# Patient Record
Sex: Female | Born: 1960 | Race: Black or African American | Hispanic: No | Smoking: Never smoker
Health system: Southern US, Community
[De-identification: ages and names within clinical notes are randomized; demographics above are authoritative.]

## PROBLEM LIST (undated history)

## (undated) ENCOUNTER — Emergency Department (HOSPITAL_COMMUNITY): Admission: EM | Payer: No Typology Code available for payment source | Source: Home / Self Care

## (undated) DIAGNOSIS — E119 Type 2 diabetes mellitus without complications: Secondary | ICD-10-CM

## (undated) DIAGNOSIS — J45909 Unspecified asthma, uncomplicated: Secondary | ICD-10-CM

## (undated) DIAGNOSIS — K5190 Ulcerative colitis, unspecified, without complications: Secondary | ICD-10-CM

## (undated) DIAGNOSIS — I10 Essential (primary) hypertension: Secondary | ICD-10-CM

## (undated) HISTORY — DX: Ulcerative colitis, unspecified, without complications: K51.90

## (undated) HISTORY — DX: Unspecified asthma, uncomplicated: J45.909

---

## 2015-04-23 ENCOUNTER — Encounter (HOSPITAL_BASED_OUTPATIENT_CLINIC_OR_DEPARTMENT_OTHER): Payer: Self-pay | Admitting: *Deleted

## 2015-04-23 ENCOUNTER — Emergency Department (HOSPITAL_BASED_OUTPATIENT_CLINIC_OR_DEPARTMENT_OTHER): Payer: BLUE CROSS/BLUE SHIELD

## 2015-04-23 ENCOUNTER — Emergency Department (HOSPITAL_BASED_OUTPATIENT_CLINIC_OR_DEPARTMENT_OTHER)
Admission: EM | Admit: 2015-04-23 | Discharge: 2015-04-23 | Disposition: A | Discharge status: Home / Self Care | Payer: BLUE CROSS/BLUE SHIELD | Attending: Emergency Medicine | Admitting: Emergency Medicine

## 2015-04-23 DIAGNOSIS — Y9289 Other specified places as the place of occurrence of the external cause: Secondary | ICD-10-CM | POA: Insufficient documentation

## 2015-04-23 DIAGNOSIS — Y9389 Activity, other specified: Secondary | ICD-10-CM | POA: Insufficient documentation

## 2015-04-23 DIAGNOSIS — Y990 Civilian activity done for income or pay: Secondary | ICD-10-CM | POA: Insufficient documentation

## 2015-04-23 DIAGNOSIS — S8992XA Unspecified injury of left lower leg, initial encounter: Secondary | ICD-10-CM | POA: Diagnosis not present

## 2015-04-23 DIAGNOSIS — M25462 Effusion, left knee: Secondary | ICD-10-CM | POA: Diagnosis not present

## 2015-04-23 DIAGNOSIS — X58XXXA Exposure to other specified factors, initial encounter: Secondary | ICD-10-CM | POA: Diagnosis not present

## 2015-04-23 MED ORDER — IBUPROFEN 600 MG PO TABS: 600.0000 mg | ORAL_TABLET | Freq: Four times a day (QID) | ORAL | Status: DC | PRN

## 2015-04-23 MED ORDER — OXYCODONE-ACETAMINOPHEN 5-325 MG PO TABS
1.0000 | ORAL_TABLET | Freq: Once | ORAL | Status: AC
  Administered 2015-04-23: 1 via ORAL
  Filled 2015-04-23: qty 1

## 2015-04-23 NOTE — ED Notes (Signed)
Patient transported to X-ray via stretcher per tech. 

## 2015-04-23 NOTE — ED Provider Notes (Signed)
CSN: [id number]     Arrival date & time 04/23/15  1134 History   First MD Initiated Contact with Patient 04/23/15 1201     Chief Complaint  Patient presents with  . Knee Pain     (Consider location/radiation/quality/duration/timing/severity/associated sxs/prior Treatment) HPI Comments: Pt. Is a 54 y/o F here with acute onset left knee pain. 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 She says that her pain has hurt more on the medial side, and that it has a "crunching" sound when she walks. She says she iced her knee when she got home, elevated it but has not taken anything for pain. She says that she has not had any further giving out, but the pain persists. She has had very little swelling, no ecchymosis. She is able to bear weight but continues to limp. She has never injured her knee. She says she has no other known medical problems. She used a knee sleeve for stabilization over the past few days. The joint is not red, swollen, she has not had any fevers or chills.   The history is provided by the patient.    History reviewed. No pertinent past medical history. Past Surgical History  Procedure Laterality Date  . Cesarean section     No family history on file. History  Substance Use Topics  . Smoking status: Never Smoker   . Smokeless tobacco: Never Used  . Alcohol Use: No   OB History    No data available     Review of Systems  Constitutional: Negative.  Negative for fever, chills, diaphoresis, activity change, appetite change and fatigue.  HENT: Negative for congestion, sore throat, trouble swallowing and voice change.   Eyes: Negative.  Negative for photophobia, pain, redness and visual disturbance.  Respiratory: Negative.  Negative for cough, choking and wheezing.   Cardiovascular: Negative.   Negative for chest pain, palpitations and leg swelling.  Gastrointestinal: Negative.  Negative for nausea, vomiting, abdominal pain, diarrhea, constipation and abdominal distention.  Endocrine: Negative.  Negative for polyuria.  Genitourinary: Negative.  Negative for frequency.  Musculoskeletal: Positive for joint swelling, arthralgias and gait problem. Negative for myalgias, back pain, neck pain and neck stiffness.  Skin: Negative.  Negative for rash and wound.  Allergic/Immunologic: Negative.   Neurological: Negative.  Negative for dizziness, numbness and headaches.  Hematological: Negative.   Psychiatric/Behavioral: Negative.  Negative for behavioral problems and agitation.      Allergies  Review of patient's allergies indicates no known allergies.  Home Medications   Prior to Admission medications   Medication Sig Start Date End Date Taking? Authorizing Provider  ibuprofen (ADVIL,MOTRIN) 600 MG tablet Take 1 tablet (600 mg total) by mouth every 6 (six) hours as needed. 04/23/15   Hillery Hunter Meli Faley, MD   BP 120/78 mmHg  Pulse 69  Temp(Src) 98.4 F (36.9 C) (Oral)  Resp 18  Ht 5\' 2"  (1.575 m)  Wt 207 lb (93.895 kg)  BMI 37.85 kg/m2  SpO2 99% Physical Exam  Constitutional: She is oriented to person, place, and time. She appears well-developed and well-nourished. No distress.  HENT:  Head: Normocephalic and atraumatic.  Mouth/Throat: Oropharynx is clear and moist. No oropharyngeal exudate.  Eyes: Conjunctivae and EOM are normal. Pupils are equal,  round, and reactive to light.  Neck: Normal range of motion. Neck supple. No thyromegaly present.  Cardiovascular: Normal rate, regular rhythm, normal heart sounds and intact distal pulses.  Exam reveals no gallop and no friction rub.   No murmur heard. Pulmonary/Chest: Effort normal and breath sounds normal. No respiratory distress. She has no wheezes. She has no rales. She exhibits no tenderness.  Abdominal: Soft. Bowel sounds are  normal. She exhibits no distension and no mass. There is no tenderness. There is no rebound and no guarding.  Musculoskeletal: She exhibits tenderness. She exhibits no edema.       Left knee: She exhibits decreased range of motion, swelling, effusion, bony tenderness and abnormal meniscus. She exhibits no ecchymosis, no deformity, no laceration, no erythema, normal alignment, no LCL laxity, normal patellar mobility and no MCL laxity. Tenderness found. Medial joint line, MCL and patellar tendon tenderness noted. No lateral joint line and no LCL tenderness noted.       Legs: Lymphadenopathy:    She has no cervical adenopathy.  Neurological: She is alert and oriented to person, place, and time. She exhibits normal muscle tone. Coordination normal.  Skin: Skin is warm and dry. No rash noted. She is not diaphoretic. No erythema. No pallor.  Psychiatric: She has a normal mood and affect. Her behavior is normal.    ED Course  Procedures (including critical care time) Labs Review Labs Reviewed - No data to display  Imaging Review Dg Knee Complete 4 Views Left  04/23/2015   CLINICAL DATA:  LEFT knee pain for 1 week  EXAM: LEFT KNEE - COMPLETE 4+ VIEW  COMPARISON:  None.  FINDINGS: No fracture dislocation of the LEFT knee. There is spurring of the superior aspect of the patella. Small suprapatellar joint effusion.  IMPRESSION: Small joint effusion.  No acute findings otherwise.   Electronically Signed   By: Genevive Bi M.D.   On: 04/23/2015 12:34     EKG Interpretation None      MDM   Final diagnoses:  Knee injury, left, initial encounter    Pt. Is 54 y/o F here with knee injury while ambulating on flat surface. By exam most likely MCL / Meniscus. XR knee is negative for acute bony abnormality, some soft tissue swelling. Recommend RICE therapy, compression sleeve. NSAID's for pain. Referral to sports medicine placed. Pt. To call for follow up appointment with sports medicine. Safe for  discharge to home with close follow up.     Yolande Jolly, MD 04/23/15 1538  Nelva Nay, MD 04/24/15 [phone]

## 2015-04-23 NOTE — Discharge Instructions (Signed)
Knee, Cartilage (Meniscus) Injury  It is suspected that you have a torn cartilage (meniscus) in your knee. The menisci are made of tough cartilage and fit between the surfaces of the thigh and leg bones. The menisci are C-shaped and have a wedged profile. The wedged profile helps the stability of the joint by keeping the rounded femur surface from sliding off the flat tibial surface. The menisci are fed (nourished) by small blood vessels, but there is also a large area at the inner edge of the meniscus that does not have a good blood supply (avascular). This presents a problem when there is an injury to the meniscus because areas without good blood supply heal poorly. As a result when there is a torn cartilage in the knee, surgery is often required to fix it. This is usually done with a surgical procedure less invasive than open surgery (arthroscopy). Some times open surgery of the knee is required if there is other damage.  PURPOSE OF THE MENISCUS  The medial meniscus rests on the medial tibial plateau. The tibia is the large bone in your lower leg (the shin bone). The medial tibial plateau is the upper end of the bone making up the inner part of your knee. The lateral meniscus serves the same purpose and is located on the outside of the knee. The menisci help to distribute your body weight across the knee joint; they act as shock absorbers. Without the meniscus present, the weight of your body would be unevenly applied to the bones in your legs (the femur and tibia). The femur is the large bone in your thigh. This uneven weight distribution would cause increased wear and tear on the cartilage lining the joint surfaces, leading to early damage (arthritis) of these areas. The presence of the menisci cartilage is necessary for a healthy knee.  PURPOSE OF THE KNEE CARTILAGE  The knee joint is made up of three bones: the thigh bone (femur), the shin bone (tibia), and the knee cap (patella). The surfaces of these bones  at the knee joint are covered with cartilage called articular cartilage. This smooth, slippery surface allows the bones to slide against each other without causing bone damage. The meniscus sits between these cartilaginous surfaces of the bones. It distributes the weight evenly in the joints and helps with the stability of the joint (keeps the joint steady).  HOME CARE INSTRUCTIONS  · Use crutches and external braces as instructed.  · Once home, an ice pack applied to your injured knee may help with discomfort and keep the swelling down. An ice pack can be used for the first couple of days or as instructed.  · Only take over-the-counter or prescription medicines for pain, discomfort, or fever as directed by your caregiver.  · Call if you do not have relief of pain with medications or if there is increasing in pain.  · Call if your foot becomes cold or blue.  · You may resume normal diet and activities as directed.  · Make sure to keep your appointments with your follow-up caregiver. This injury may require further evaluation and treatment beyond the temporary treatment given today.  Document Released: 12/01/2002 Document Revised: 01/25/2014 Document Reviewed: 03/25/2009  [employer]® Patient Information ©2015 [employer]. This information is not intended to replace advice given to you by your health care provider. Make sure you discuss any questions you have with your health care provider.

## 2015-04-23 NOTE — ED Notes (Signed)
Pt reports pain in left knee and has been "giving out" since Wednesday- denies known injury, denies fall

## 2015-04-26 ENCOUNTER — Ambulatory Visit (INDEPENDENT_AMBULATORY_CARE_PROVIDER_SITE_OTHER): Payer: BLUE CROSS/BLUE SHIELD | Admitting: Family Medicine

## 2015-04-26 ENCOUNTER — Encounter: Payer: Self-pay | Admitting: Family Medicine

## 2015-04-26 VITALS — BP 137/84 | HR 90 | Ht 62.0 in | Wt 207.0 lb

## 2015-04-26 DIAGNOSIS — M25562 Pain in left knee: Secondary | ICD-10-CM | POA: Diagnosis not present

## 2015-04-26 DIAGNOSIS — M222X2 Patellofemoral disorders, left knee: Secondary | ICD-10-CM | POA: Insufficient documentation

## 2015-04-26 MED ORDER — METHYLPREDNISOLONE ACETATE 40 MG/ML IJ SUSP
40.0000 mg | Freq: Once | INTRAMUSCULAR | Status: AC
  Administered 2015-04-26: 40 mg via INTRA_ARTICULAR

## 2015-04-26 NOTE — Progress Notes (Signed)
PCP: No primary care provider on file.  Subjective:   HPI: Patient is a 54 y.o. female here for left knee pain.  Patient reports on 7/27 she was walking at work and felt her knee pop. Pop was felt anteromedial. Has been elevating, icing knee since then. No prior injuries. Limping as well. Wearing a knee sleeve. Difficulty getting up after prolonged sitting. + swelling. No catching, locking though concerned knee will give out.  No past medical history on file.  Current Outpatient Prescriptions on File Prior to Visit  Medication Sig Dispense Refill  . ibuprofen (ADVIL,MOTRIN) 600 MG tablet Take 1 tablet (600 mg total) by mouth every 6 (six) hours as needed. 30 tablet 0   No current facility-administered medications on file prior to visit.    Past Surgical History  Procedure Laterality Date  . Cesarean section      No Known Allergies  History   Social History  . Marital Status: Divorced    Spouse Name: N/A  . Number of Children: N/A  . Years of Education: N/A   Occupational History  . Not on file.   Social History Main Topics  . Smoking status: Never Smoker   . Smokeless tobacco: Never Used  . Alcohol Use: No  . Drug Use: No  . Sexual Activity: Not on file   Other Topics Concern  . Not on file   Social History Narrative    No family history on file.  BP 137/84 mmHg  Pulse 90  Ht  (1.575 m)  Wt 207 lb (93.895 kg)  BMI 37.85 kg/m2  Review of Systems: See HPI above.    Objective:  Physical Exam:  Gen: NAD  Left knee: Mild effusion.  No bruising, other deformity. TTP medial joint line and pes bursa ROM 0 - 90 degrees. Negative ant/post drawers. Negative valgus/varus testing. Negative lachmanns. Positive mcmurrays, apleys.  Negative patellar apprehension. NV intact distally.    Assessment & Plan:  1. Left knee pain - consistent with medial meniscus tear with some pes bursitis.  Radiographs negative for fracture.  Not weight bearing with  these but preserved joint spaces.  Discussed options - start with conservative management - tylenol, nsaids, topical medications, home exercises.  Injection given today.  F/u in 1 month.  After informed written consent, patient was lying supine on exam table. Left knee was prepped with alcohol swab and utilizing superolateral approach with ultrasound guidance, patient's left knee was injected intraarticularly with 3:1 marcaine: depomedrol. Patient tolerated the procedure well without immediate complications.

## 2015-04-26 NOTE — Patient Instructions (Signed)
I'm concerned you have a medial meniscus tear in this knee. Treatment for this is conservative as 80-85% of these heal on their own or become asymptomatic. Take tylenol  1-2 tabs three times a day for pain. Aleve 1-2 tabs twice a day with food Consider Capsaicin, aspercreme, or biofreeze topically up to four times a day - may also help with pain. Cortisone injections are an option - you were given one of these today. It's important that you continue to stay active. Straight leg raises, knee extensions 3 sets of 10 once a day (add ankle weight if these become too easy). Consider physical therapy to strengthen muscles around the joint that hurts to take pressure off of the joint itself. Heat or ice 15 minutes at a time 3-4 times a day as needed to help with pain. Follow up with me in 1 month. See work note for the few restrictions.

## 2015-04-26 NOTE — Assessment & Plan Note (Signed)
consistent with medial meniscus tear with some pes bursitis.  Radiographs negative for fracture.  Not weight bearing with these but preserved joint spaces.  Discussed options - start with conservative management - tylenol, nsaids, topical medications, home exercises.  Injection given today.  F/u in 1 month.  After informed written consent, patient was lying supine on exam table. Left knee was prepped with alcohol swab and utilizing superolateral approach with ultrasound guidance, patient's left knee was injected intraarticularly with 3:1 marcaine: depomedrol. Patient tolerated the procedure well without immediate complications.

## 2015-08-05 ENCOUNTER — Ambulatory Visit (INDEPENDENT_AMBULATORY_CARE_PROVIDER_SITE_OTHER): Payer: BLUE CROSS/BLUE SHIELD | Admitting: Family Medicine

## 2015-08-05 ENCOUNTER — Encounter: Payer: Self-pay | Admitting: Family Medicine

## 2015-08-05 VITALS — BP 142/93 | HR 84 | Ht 62.0 in

## 2015-08-05 DIAGNOSIS — M25562 Pain in left knee: Secondary | ICD-10-CM | POA: Diagnosis not present

## 2015-08-05 NOTE — Patient Instructions (Signed)
Light duty with the only restrictions being no deep squats, crawling for about 6 weeks then return to full duty. I don't think you're going to have any problems going forward but it's not uncommon to have a little soreness. Follow up with me in 6 weeks or as needed.

## 2015-08-08 NOTE — Progress Notes (Signed)
PCP: No primary care provider on file.  Subjective:   HPI: Patient is a 54 y.o. female here for left knee pain.  8/2: Patient reports on 7/27 she was walking at work and felt her knee pop. Pop was felt anteromedial. Has been elevating, icing knee since then. No prior injuries. Limping as well. Wearing a knee sleeve. Difficulty getting up after prolonged sitting. + swelling. No catching, locking though concerned knee will give out.  11/11: Patient reports her left knee is improved since last visit, injection. Pain level 0/10. Occasional pain getting up from seated position. No skin changes, fever, other complaints.  No past medical history on file.  No current outpatient prescriptions on file prior to visit.   No current facility-administered medications on file prior to visit.    Past Surgical History  Procedure Laterality Date  . Cesarean section      No Known Allergies  Social History   Social History  . Marital Status: Divorced    Spouse Name: N/A  . Number of Children: N/A  . Years of Education: N/A   Occupational History  . Not on file.   Social History Main Topics  . Smoking status: Never Smoker   . Smokeless tobacco: Never Used  . Alcohol Use: No  . Drug Use: No  . Sexual Activity: Not on file   Other Topics Concern  . Not on file   Social History Narrative    No family history on file.  BP 142/93 mmHg  Pulse 84  Ht 5\' 2"  (1.575 m)  Review of Systems: See HPI above.    Objective:  Physical Exam:  Gen: NAD, comfortable in exam room.  Left knee: No effusion.  No bruising, other deformity. No tenderness. FROM. Negative ant/post drawers. Negative valgus/varus testing. Negative lachmanns. Negative mcmurrays, apleys.  Negative patellar apprehension. NV intact distally.  Right knee: FROM without pain.    Assessment & Plan:  1. Left knee pain - consistent with medial meniscus tear with some pes bursitis.  Clinically improved at  this point following injection, home exercises.  Light duty (see letter) for 6 weeks then full duty.  F/u in 6 weeks or prn.

## 2015-08-08 NOTE — Assessment & Plan Note (Signed)
consistent with medial meniscus tear with some pes bursitis.  Clinically improved at this point following injection, home exercises.  Light duty (see letter) for 6 weeks then full duty.  F/u in 6 weeks or prn.

## 2015-09-14 ENCOUNTER — Encounter: Payer: Self-pay | Admitting: Family Medicine

## 2015-09-14 ENCOUNTER — Ambulatory Visit (INDEPENDENT_AMBULATORY_CARE_PROVIDER_SITE_OTHER): Payer: BLUE CROSS/BLUE SHIELD | Admitting: Family Medicine

## 2015-09-14 VITALS — BP 149/91 | HR 96 | Ht 62.0 in | Wt 207.0 lb

## 2015-09-14 DIAGNOSIS — M25562 Pain in left knee: Secondary | ICD-10-CM

## 2015-09-14 NOTE — Assessment & Plan Note (Signed)
consistent with medial meniscus tear with some pes bursitis.  Clinically significantly better.  Return to full duty.  F/u prn.

## 2015-09-14 NOTE — Progress Notes (Signed)
PCP: No PCP Per Patient  Subjective:   HPI: Patient is a 54 y.o. female here for left knee pain.  8/2: Patient reports on 7/27 she was walking at work and felt her knee pop. Pop was felt anteromedial. Has been elevating, icing knee since then. No prior injuries. Limping as well. Wearing a knee sleeve. Difficulty getting up after prolonged sitting. + swelling. No catching, locking though concerned knee will give out.  11/11: Patient reports her left knee is improved since last visit, injection. Pain level 0/10. Occasional pain getting up from seated position. No skin changes, fever, other complaints.  12/21: Patient reports she is over 99% improved. No pain - gets occasional soreness that is fleeting. Pain 0/10 now. Doing home exercises. No skin changes, fever, other complaints.  No past medical history on file.  Current Outpatient Prescriptions on File Prior to Visit  Medication Sig Dispense Refill  . meloxicam (MOBIC) 15 MG tablet      No current facility-administered medications on file prior to visit.    Past Surgical History  Procedure Laterality Date  . Cesarean section      No Known Allergies  Social History   Social History  . Marital Status: Divorced    Spouse Name: N/A  . Number of Children: N/A  . Years of Education: N/A   Occupational History  . Not on file.   Social History Main Topics  . Smoking status: Never Smoker   . Smokeless tobacco: Never Used  . Alcohol Use: No  . Drug Use: No  . Sexual Activity: Not on file   Other Topics Concern  . Not on file   Social History Narrative    No family history on file.  BP 149/91 mmHg  Pulse 96  Ht 5\' 2"  (1.575 m)  Wt 207 lb (93.895 kg)  BMI 37.85 kg/m2  Review of Systems: See HPI above.    Objective:  Physical Exam:  Gen: NAD, comfortable in exam room.  Left knee: No effusion.  No bruising, other deformity. No tenderness. FROM. Negative ant/post drawers. Negative  valgus/varus testing. Negative lachmanns. Negative mcmurrays, apleys.  Negative patellar apprehension. NV intact distally.  Right knee: FROM without pain.    Assessment & Plan:  1. Left knee pain - consistent with medial meniscus tear with some pes bursitis.  Clinically significantly better.  Return to full duty.  F/u prn.

## 2017-10-26 ENCOUNTER — Encounter (HOSPITAL_COMMUNITY): Payer: Self-pay | Admitting: Emergency Medicine

## 2017-10-26 ENCOUNTER — Emergency Department (HOSPITAL_COMMUNITY): Payer: BLUE CROSS/BLUE SHIELD

## 2017-10-26 ENCOUNTER — Emergency Department (HOSPITAL_COMMUNITY)
Admission: EM | Admit: 2017-10-26 | Discharge: 2017-10-26 | Disposition: A | Discharge status: Home / Self Care | Payer: BLUE CROSS/BLUE SHIELD | Attending: Emergency Medicine | Admitting: Emergency Medicine

## 2017-10-26 DIAGNOSIS — M545 Low back pain: Secondary | ICD-10-CM | POA: Diagnosis not present

## 2017-10-26 DIAGNOSIS — S6991XA Unspecified injury of right wrist, hand and finger(s), initial encounter: Secondary | ICD-10-CM | POA: Diagnosis present

## 2017-10-26 DIAGNOSIS — R202 Paresthesia of skin: Secondary | ICD-10-CM | POA: Diagnosis not present

## 2017-10-26 DIAGNOSIS — Y92410 Unspecified street and highway as the place of occurrence of the external cause: Secondary | ICD-10-CM | POA: Insufficient documentation

## 2017-10-26 DIAGNOSIS — Y999 Unspecified external cause status: Secondary | ICD-10-CM | POA: Diagnosis not present

## 2017-10-26 DIAGNOSIS — M79641 Pain in right hand: Secondary | ICD-10-CM | POA: Diagnosis not present

## 2017-10-26 DIAGNOSIS — M79642 Pain in left hand: Secondary | ICD-10-CM | POA: Insufficient documentation

## 2017-10-26 DIAGNOSIS — E119 Type 2 diabetes mellitus without complications: Secondary | ICD-10-CM | POA: Insufficient documentation

## 2017-10-26 DIAGNOSIS — Y9389 Activity, other specified: Secondary | ICD-10-CM | POA: Insufficient documentation

## 2017-10-26 DIAGNOSIS — I10 Essential (primary) hypertension: Secondary | ICD-10-CM | POA: Diagnosis not present

## 2017-10-26 HISTORY — DX: Essential (primary) hypertension: I10

## 2017-10-26 HISTORY — DX: Type 2 diabetes mellitus without complications: E11.9

## 2017-10-26 LAB — CBC
HCT: 40.9 % (ref 36.0–46.0)
Hemoglobin: 13.5 g/dL (ref 12.0–15.0)
MCH: 26.2 pg (ref 26.0–34.0)
MCHC: 33 g/dL (ref 30.0–36.0)
MCV: 79.3 fL (ref 78.0–100.0)
Platelets: 207 10*3/uL (ref 150–400)
RBC: 5.16 MIL/uL — ABNORMAL HIGH (ref 3.87–5.11)
RDW: 15.5 % (ref 11.5–15.5)
WBC: 6.7 10*3/uL (ref 4.0–10.5)

## 2017-10-26 LAB — COMPREHENSIVE METABOLIC PANEL
ALT: 15 U/L (ref 14–54)
AST: 29 U/L (ref 15–41)
Albumin: 3.9 g/dL (ref 3.5–5.0)
Alkaline Phosphatase: 63 U/L (ref 38–126)
Anion gap: 11 (ref 5–15)
BILIRUBIN TOTAL: 0.9 mg/dL (ref 0.3–1.2)
BUN: 8 mg/dL (ref 6–20)
CO2: 23 mmol/L (ref 22–32)
CREATININE: 0.76 mg/dL (ref 0.44–1.00)
Calcium: 9 mg/dL (ref 8.9–10.3)
Chloride: 101 mmol/L (ref 101–111)
GFR calc Af Amer: >60 mL/min (ref >60)
Glucose, Bld: 97 mg/dL (ref 65–99)
POTASSIUM: 4.4 mmol/L (ref 3.5–5.1)
Sodium: 135 mmol/L (ref 135–145)
TOTAL PROTEIN: 7.6 g/dL (ref 6.5–8.1)

## 2017-10-26 LAB — PROTIME-INR
INR: 0.93
PROTHROMBIN TIME: 12.4 s (ref 11.4–15.2)

## 2017-10-26 LAB — SAMPLE TO BLOOD BANK

## 2017-10-26 LAB — I-STAT CHEM 8, ED
BUN: 8 mg/dL (ref 6–20)
CREATININE: 0.7 mg/dL (ref 0.44–1.00)
Calcium, Ion: 1.06 mmol/L — ABNORMAL LOW (ref 1.15–1.40)
Chloride: 103 mmol/L (ref 101–111)
Glucose, Bld: 99 mg/dL (ref 65–99)
HEMATOCRIT: 43 % (ref 36.0–46.0)
Hemoglobin: 14.6 g/dL (ref 12.0–15.0)
Potassium: 4.1 mmol/L (ref 3.5–5.1)
Sodium: 139 mmol/L (ref 135–145)
TCO2: 26 mmol/L (ref 22–32)

## 2017-10-26 LAB — URINALYSIS, ROUTINE W REFLEX MICROSCOPIC
Bilirubin Urine: NEGATIVE
Glucose, UA: NEGATIVE mg/dL
Hgb urine dipstick: NEGATIVE
Ketones, ur: NEGATIVE mg/dL
Leukocytes, UA: NEGATIVE
NITRITE: NEGATIVE
PROTEIN: NEGATIVE mg/dL
Specific Gravity, Urine: >1.046 — ABNORMAL HIGH (ref 1.005–1.030)
pH: 8 (ref 5.0–8.0)

## 2017-10-26 LAB — ETHANOL: Alcohol, Ethyl (B): <10 mg/dL (ref <10)

## 2017-10-26 LAB — I-STAT CG4 LACTIC ACID, ED: LACTIC ACID, VENOUS: 1.57 mmol/L (ref 0.5–1.9)

## 2017-10-26 MED ORDER — FENTANYL CITRATE (PF) 100 MCG/2ML IJ SOLN
25.0000 ug | Freq: Once | INTRAMUSCULAR | Status: AC
  Administered 2017-10-26: 25 ug via INTRAVENOUS
  Filled 2017-10-26: qty 2

## 2017-10-26 MED ORDER — ONDANSETRON HCL 4 MG/2ML IJ SOLN
4.0000 mg | Freq: Once | INTRAMUSCULAR | Status: AC
  Administered 2017-10-26: 4 mg via INTRAVENOUS
  Filled 2017-10-26: qty 2

## 2017-10-26 MED ORDER — IOPAMIDOL (ISOVUE-300) INJECTION 61%
INTRAVENOUS | Status: AC
  Administered 2017-10-26: 100 mL
  Filled 2017-10-26: qty 100
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
## 2017-10-26 NOTE — ED Notes (Signed)
Pt understood dc material. NAD noted. Work excuse given at dc 
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
## 2017-10-26 NOTE — ED Notes (Signed)
MD at bedside. 

## 2017-10-26 NOTE — ED Notes (Signed)
Pt ambulated without trouble. Stated she felt fine and was ready to go home

## 2017-10-26 NOTE — Discharge Instructions (Addendum)
When advanced CT imaging is performed, often finds unrelated to today's visit are identified.  Please discuss these incidental findings with your primary care doctor. 1. Multiple benign liver cysts. 2. Nonobstructing right intrarenal calculus. 3. Small left renal cyst. Aortic atherosclerosis.

## 2017-10-27 LAB — CDS SEROLOGY

## 2017-11-04 ENCOUNTER — Encounter (HOSPITAL_COMMUNITY): Payer: Self-pay | Admitting: Emergency Medicine

## 2017-11-04 ENCOUNTER — Ambulatory Visit (HOSPITAL_COMMUNITY)
Admission: EM | Admit: 2017-11-04 | Discharge: 2017-11-04 | Disposition: A | Discharge status: Home / Self Care | Payer: BLUE CROSS/BLUE SHIELD | Attending: Internal Medicine | Admitting: Internal Medicine

## 2017-11-04 DIAGNOSIS — S39012A Strain of muscle, fascia and tendon of lower back, initial encounter: Secondary | ICD-10-CM

## 2017-11-04 MED ORDER — CYCLOBENZAPRINE HCL 5 MG PO TABS: 5.0000 mg | ORAL_TABLET | Freq: Every day | ORAL | 0 refills | Status: DC

## 2017-11-04 MED ORDER — NAPROXEN 375 MG PO TABS: 375.0000 mg | ORAL_TABLET | Freq: Two times a day (BID) | ORAL | 0 refills | Status: DC

## 2017-11-04 MED ORDER — PREDNISONE 10 MG (21) PO TBPK: ORAL_TABLET | Freq: Every day | ORAL | 0 refills | Status: DC
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
## 2017-11-04 NOTE — Discharge Instructions (Signed)
Heat/ice to low back. Activity as tolerated.  Complete course of steroids. Naproxen twice a day, take with food May take a muscle relaxer at night, may cause drowsiness so do not drive or drink alcohol if take. Please establish with a primary care provider for further management of your low back pain.

## 2017-11-20 ENCOUNTER — Ambulatory Visit: Payer: Self-pay | Admitting: Emergency Medicine

## 2017-11-22 ENCOUNTER — Ambulatory Visit (INDEPENDENT_AMBULATORY_CARE_PROVIDER_SITE_OTHER): Payer: BLUE CROSS/BLUE SHIELD | Admitting: Emergency Medicine

## 2017-11-22 ENCOUNTER — Other Ambulatory Visit: Payer: Self-pay

## 2017-11-22 ENCOUNTER — Encounter: Payer: Self-pay | Admitting: Emergency Medicine

## 2017-11-22 VITALS — BP 128/66 | HR 77 | Temp 98.1°F | Resp 16 | Ht 62.0 in | Wt 206.0 lb

## 2017-11-22 DIAGNOSIS — M545 Low back pain, unspecified: Secondary | ICD-10-CM

## 2017-11-22 DIAGNOSIS — M7918 Myalgia, other site: Secondary | ICD-10-CM | POA: Insufficient documentation

## 2017-11-22 DIAGNOSIS — M62830 Muscle spasm of back: Secondary | ICD-10-CM | POA: Diagnosis not present

## 2017-11-22 MED ORDER — DICLOFENAC SODIUM 75 MG PO TBEC: 75.0000 mg | DELAYED_RELEASE_TABLET | Freq: Two times a day (BID) | ORAL | 0 refills | Status: AC

## 2017-11-22 MED ORDER — CYCLOBENZAPRINE HCL 10 MG PO TABS: 10.0000 mg | ORAL_TABLET | Freq: Every day | ORAL | 0 refills | Status: DC

## 2017-11-22 NOTE — Patient Instructions (Addendum)
     IF you received an x-ray today, you will receive an invoice from [facility]. Please contact [facility] at [phone] with questions or concerns regarding your invoice.   IF you received labwork today, you will receive an invoice from [facility]. Please contact [facility] at [phone] with questions or concerns regarding your invoice.   Our billing staff will not be able to assist you with questions regarding bills from these companies.  You will be contacted with the lab results as soon as they are available. The fastest way to get your results is to activate your My Chart account. Instructions are located on the last page of this paperwork. If you have not heard from us regarding the results in 2 weeks, please contact this office.      Back Pain, Adult Back pain is very common. The pain often gets better over time. The cause of back pain is usually not dangerous. Most people can learn to manage their back pain on their own. Follow these instructions at home: Watch your back pain for any changes. The following actions may help to lessen any pain you are feeling:  Stay active. Start with short walks on flat ground if you can. Try to walk farther each day.  Exercise regularly as told by your doctor. Exercise helps your back heal faster. It also helps avoid future injury by keeping your muscles strong and flexible.  Do not sit, drive, or stand in one place for more than 30 minutes.  Do not stay in bed. Resting more than 1-2 days can slow down your recovery.  Be careful when you bend or lift an object. Use good form when lifting: ? Bend at your knees. ? Keep the object close to your body. ? Do not twist.  Sleep on a firm mattress. Lie on your side, and bend your knees. If you lie on your back, put a pillow under your knees.  Take medicines only as told by your doctor.  Put ice on the injured area. ? Put ice in a plastic bag. ? Place a towel between your  skin and the bag. ? Leave the ice on for 20 minutes, 2-3 times a day for the first 2-3 days. After that, you can switch between ice and heat packs.  Avoid feeling anxious or stressed. Find good ways to deal with stress, such as exercise.  Maintain a healthy weight. Extra weight puts stress on your back.  Contact a doctor if:  You have pain that does not go away with rest or medicine.  You have worsening pain that goes down into your legs or buttocks.  You have pain that does not get better in one week.  You have pain at night.  You lose weight.  You have a fever or chills. Get help right away if:  You cannot control when you poop (bowel movement) or pee (urinate).  Your arms or legs feel weak.  Your arms or legs lose feeling (numbness).  You feel sick to your stomach (nauseous) or throw up (vomit).  You have belly (abdominal) pain.  You feel like you may pass out (faint). This information is not intended to replace advice given to you by your health care provider. Make sure you discuss any questions you have with your health care provider. Document Released: 02/27/2008 Document Revised: 02/16/2016 Document Reviewed: 01/12/2014 Elsevier Interactive Patient Education  2018 Elsevier Inc.  
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
## 2017-11-22 NOTE — Addendum Note (Signed)
Addended by: Georg Ruddle A on: 11/22/2017 01:29 PM   Modules accepted: Orders

## 2017-11-26 ENCOUNTER — Ambulatory Visit (INDEPENDENT_AMBULATORY_CARE_PROVIDER_SITE_OTHER): Payer: BLUE CROSS/BLUE SHIELD | Admitting: Emergency Medicine

## 2017-11-26 ENCOUNTER — Encounter: Payer: Self-pay | Admitting: Emergency Medicine

## 2017-11-26 ENCOUNTER — Other Ambulatory Visit: Payer: Self-pay

## 2017-11-26 VITALS — BP 145/80 | HR 77 | Temp 98.2°F | Resp 16 | Ht 61.5 in | Wt 205.2 lb

## 2017-11-26 DIAGNOSIS — N281 Cyst of kidney, acquired: Secondary | ICD-10-CM | POA: Diagnosis not present

## 2017-11-26 DIAGNOSIS — K7689 Other specified diseases of liver: Secondary | ICD-10-CM | POA: Diagnosis not present

## 2017-11-26 DIAGNOSIS — N200 Calculus of kidney: Secondary | ICD-10-CM | POA: Diagnosis not present

## 2017-11-26 NOTE — Patient Instructions (Signed)
     IF you received an x-ray today, you will receive an invoice from [facility]. Please contact [facility] at [phone] with questions or concerns regarding your invoice.   IF you received labwork today, you will receive an invoice from [facility]. Please contact [facility] at [phone] with questions or concerns regarding your invoice.   Our billing staff will not be able to assist you with questions regarding bills from these companies.  You will be contacted with the lab results as soon as they are available. The fastest way to get your results is to activate your My Chart account. Instructions are located on the last page of this paperwork. If you have not heard from us regarding the results in 2 weeks, please contact this office.     
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
## 2018-01-29 ENCOUNTER — Encounter: Payer: Self-pay | Admitting: Emergency Medicine

## 2018-03-07 ENCOUNTER — Other Ambulatory Visit: Payer: Self-pay

## 2018-03-07 ENCOUNTER — Encounter: Payer: Self-pay | Admitting: Emergency Medicine

## 2018-03-07 ENCOUNTER — Ambulatory Visit (INDEPENDENT_AMBULATORY_CARE_PROVIDER_SITE_OTHER): Payer: BLUE CROSS/BLUE SHIELD | Admitting: Emergency Medicine

## 2018-03-07 VITALS — BP 130/72 | HR 73 | Temp 98.0°F | Resp 16 | Ht 61.81 in | Wt 209.0 lb

## 2018-03-07 DIAGNOSIS — L988 Other specified disorders of the skin and subcutaneous tissue: Secondary | ICD-10-CM | POA: Diagnosis not present

## 2018-03-07 DIAGNOSIS — L819 Disorder of pigmentation, unspecified: Secondary | ICD-10-CM | POA: Insufficient documentation

## 2018-03-07 DIAGNOSIS — R21 Rash and other nonspecific skin eruption: Secondary | ICD-10-CM | POA: Diagnosis not present
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
## 2018-03-07 NOTE — Patient Instructions (Signed)

## 2019-01-08 ENCOUNTER — Other Ambulatory Visit: Payer: Self-pay

## 2020-04-23 ENCOUNTER — Emergency Department (HOSPITAL_BASED_OUTPATIENT_CLINIC_OR_DEPARTMENT_OTHER)
Admission: EM | Admit: 2020-04-23 | Discharge: 2020-04-24 | Disposition: A | Discharge status: Home / Self Care | Payer: BC Managed Care – PPO | Attending: Emergency Medicine | Admitting: Emergency Medicine

## 2020-04-23 ENCOUNTER — Emergency Department (HOSPITAL_BASED_OUTPATIENT_CLINIC_OR_DEPARTMENT_OTHER): Payer: BC Managed Care – PPO

## 2020-04-23 ENCOUNTER — Encounter (HOSPITAL_BASED_OUTPATIENT_CLINIC_OR_DEPARTMENT_OTHER): Payer: Self-pay | Admitting: Emergency Medicine

## 2020-04-23 DIAGNOSIS — Y9289 Other specified places as the place of occurrence of the external cause: Secondary | ICD-10-CM | POA: Diagnosis not present

## 2020-04-23 DIAGNOSIS — S39012A Strain of muscle, fascia and tendon of lower back, initial encounter: Secondary | ICD-10-CM | POA: Insufficient documentation

## 2020-04-23 DIAGNOSIS — I10 Essential (primary) hypertension: Secondary | ICD-10-CM | POA: Insufficient documentation

## 2020-04-23 DIAGNOSIS — S3992XA Unspecified injury of lower back, initial encounter: Secondary | ICD-10-CM | POA: Diagnosis present

## 2020-04-23 DIAGNOSIS — E119 Type 2 diabetes mellitus without complications: Secondary | ICD-10-CM | POA: Insufficient documentation

## 2020-04-23 DIAGNOSIS — Z79899 Other long term (current) drug therapy: Secondary | ICD-10-CM | POA: Diagnosis not present

## 2020-04-23 DIAGNOSIS — Y998 Other external cause status: Secondary | ICD-10-CM | POA: Diagnosis not present

## 2020-04-23 DIAGNOSIS — Y9389 Activity, other specified: Secondary | ICD-10-CM | POA: Diagnosis not present

## 2020-04-23 MED ORDER — KETOROLAC TROMETHAMINE 30 MG/ML IJ SOLN
30.0000 mg | Freq: Once | INTRAMUSCULAR | Status: AC
  Administered 2020-04-23: 30 mg via INTRAMUSCULAR
  Filled 2020-04-23: qty 1
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
## 2021-02-17 NOTE — Addendum Note (Signed)
Addended by: Georg Ruddle A on: 02/17/2021 11:00 AM   Modules accepted: Orders

## 2021-04-27 ENCOUNTER — Ambulatory Visit: Payer: Self-pay

## 2021-04-27 ENCOUNTER — Encounter: Payer: Self-pay | Admitting: Family Medicine

## 2021-04-27 ENCOUNTER — Ambulatory Visit: Payer: BC Managed Care – PPO | Admitting: Family Medicine

## 2021-04-27 ENCOUNTER — Other Ambulatory Visit: Payer: Self-pay

## 2021-04-27 VITALS — BP 130/90 | Ht 62.0 in

## 2021-04-27 DIAGNOSIS — M25562 Pain in left knee: Secondary | ICD-10-CM | POA: Diagnosis not present

## 2021-04-27 DIAGNOSIS — M222X2 Patellofemoral disorders, left knee: Secondary | ICD-10-CM

## 2021-04-27 NOTE — Progress Notes (Signed)
Medication Samples have been provided to the patient.  Drug name: Pennsaid       Strength: 2%        Qty: 2 boxes LOT: [id number]  Exp.Date: 03/2022  Dosing instructions: use a pea size amount  The patient has been instructed regarding the correct time, dose, and frequency of taking this medication, including desired effects and most common side effects.   Lanier Prude 11:33 AM 04/27/2021

## 2021-04-27 NOTE — Assessment & Plan Note (Signed)
Symptoms seem more associated with the instability versus the degenerative changes.  She walks a lot at work which is likely the source of her pain. -Counseled on home exercise therapy and supportive care. -Pennsaid samples. -Could consider physical therapy, imaging or injection

## 2021-04-27 NOTE — Patient Instructions (Signed)
Nice to meet you Please try ice  Please try the exercises  Please try the pennsaid   Please send me a message in MyChart with any questions or updates.  Please see me back in 4 weeks.   --Dr. Jordan Likes

## 2021-04-27 NOTE — Progress Notes (Signed)
  Tracey Blackburn - 60 y.o. female MRN [id number]  Date of birth: 06-30-1961  SUBJECTIVE:  Including CC & ROS.  No chief complaint on file.   Tracey Blackburn is a 60 y.o. female that is presenting with left knee pain.  She walks a lot at work.  Pain is anterior nature.  Denies any injury inciting event.  Has not tried any medications.  Has been present for about 2 weeks..  Independent review of the lumbar spine x-ray from 7/21 shows transitional lumbosacral anatomy with facet arthrosis.  Independent review of the left knee x-rays from 2016 show mild effusion with minimal degenerative changes.   Review of Systems See HPI   HISTORY: Past Medical, Surgical, Social, and Family History Reviewed & Updated per EMR.   Pertinent Historical Findings include:  Past Medical History:  Diagnosis Date   Diabetes mellitus without complication (HCC)    Hypertension     Past Surgical History:  Procedure Laterality Date   CESAREAN SECTION      History reviewed. No pertinent family history.  Social History   Socioeconomic History   Marital status: Divorced    Spouse name: Not on file   Number of children: Not on file   Years of education: Not on file   Highest education level: Not on file  Occupational History   Not on file  Tobacco Use   Smoking status: Never   Smokeless tobacco: Never  Substance and Sexual Activity   Alcohol use: No    Alcohol/week: 0.0 standard drinks   Drug use: No   Sexual activity: Not on file  Other Topics Concern   Not on file  Social History Narrative   Not on file   Social Determinants of Health   Financial Resource Strain: Not on file  Food Insecurity: Not on file  Transportation Needs: Not on file  Physical Activity: Not on file  Stress: Not on file  Social Connections: Not on file  Intimate Partner Violence: Not on file     PHYSICAL EXAM:  VS: BP 130/90 (BP Location: Right Arm, Patient Position: Sitting, Cuff Size: Large)   Ht 5\' 2"  (1.575 m)    BMI 38.31 kg/m  Physical Exam Gen: NAD, alert, cooperative with exam, well-appearing MSK:  Left knee: Normal range of motion. Some tenderness palpation of the anterior aspect. Normal strength resistance. Neurovascular intact  Limited ultrasound: Left knee:  No effusion in the suprapatellar pouch. Mild spurring at the superior pole of the quadriceps tendon Mild medial joint space narrowing  Summary: Mild degenerative changes of the knee.  Ultrasound and interpretation by , MD    ASSESSMENT & PLAN:   Patellofemoral pain syndrome of left knee Symptoms seem more associated with the instability versus the degenerative changes.  She walks a lot at work which is likely the source of her pain. -Counseled on home exercise therapy and supportive care. -Pennsaid samples. -Could consider physical therapy, imaging or injection

## 2021-05-25 ENCOUNTER — Ambulatory Visit: Payer: Self-pay

## 2021-05-25 ENCOUNTER — Encounter: Payer: Self-pay | Admitting: Family Medicine

## 2021-05-25 ENCOUNTER — Ambulatory Visit: Payer: BC Managed Care – PPO | Admitting: Family Medicine

## 2021-05-25 ENCOUNTER — Other Ambulatory Visit: Payer: Self-pay

## 2021-05-25 VITALS — Ht 62.0 in | Wt 209.0 lb

## 2021-05-25 DIAGNOSIS — M1712 Unilateral primary osteoarthritis, left knee: Secondary | ICD-10-CM | POA: Diagnosis not present

## 2021-05-25 MED ORDER — TRIAMCINOLONE ACETONIDE 40 MG/ML IJ SUSP
40.0000 mg | Freq: Once | INTRAMUSCULAR | Status: AC
  Administered 2021-05-25: 40 mg via INTRA_ARTICULAR

## 2021-05-25 NOTE — Patient Instructions (Signed)
Good to see you Please try ice  Please continue the exercises  I will call with the xray results.   Please send me a message in MyChart with any questions or updates.  Please see me back in 4 weeks.   --Dr. Jordan Likes

## 2021-05-25 NOTE — Assessment & Plan Note (Signed)
Acute on chronic in nature.  Limited neuro consider measures thus far. -Counseled on home exercise therapy - injection today  - xray  - could consider PT

## 2021-05-25 NOTE — Progress Notes (Signed)
  Tracey Blackburn - 60 y.o. female MRN [id number]  Date of birth: [birth year]  SUBJECTIVE:  Including CC & ROS.  No chief complaint on file.   Tracey Blackburn is a 60 y.o. female that is presenting with worsening of her left knee pain.  She continues to walk a lot at work.  Having pain in around the knee.   Review of Systems See HPI   HISTORY: Past Medical, Surgical, Social, and Family History Reviewed & Updated per EMR.   Pertinent Historical Findings include:  Past Medical History:  Diagnosis Date   Diabetes mellitus without complication (HCC)    Hypertension     Past Surgical History:  Procedure Laterality Date   CESAREAN SECTION      History reviewed. No pertinent family history.  Social History   Socioeconomic History   Marital status: Divorced    Spouse name: Not on file   Number of children: Not on file   Years of education: Not on file   Highest education level: Not on file  Occupational History   Not on file  Tobacco Use   Smoking status: Never   Smokeless tobacco: Never  Substance and Sexual Activity   Alcohol use: No    Alcohol/week: 0.0 standard drinks   Drug use: No   Sexual activity: Not on file  Other Topics Concern   Not on file  Social History Narrative   Not on file   Social Determinants of Health   Financial Resource Strain: Not on file  Food Insecurity: Not on file  Transportation Needs: Not on file  Physical Activity: Not on file  Stress: Not on file  Social Connections: Not on file  Intimate Partner Violence: Not on file     PHYSICAL EXAM:  VS: Ht 5\' 2"  (1.575 m)   Wt 209 lb (94.8 kg)   BMI 38.23 kg/m  Physical Exam Gen: NAD, alert, cooperative with exam, well-appearing    Aspiration/Injection Procedure Note Tracey Blackburn [birth year]  Procedure: Injection Indications: Left knee pain  Procedure Details Consent: Risks of procedure as well as the alternatives and risks of each were explained to the (patient/caregiver).   Consent for procedure obtained. Time Out: Verified patient identification, verified procedure, site/side was marked, verified correct patient position, special equipment/implants available, medications/allergies/relevent history reviewed, required imaging and test results available.  Performed.  The area was cleaned with iodine and alcohol swabs.    The left knee superior lateral suprapatellar pouch was injected using 3 cc 1% lidocaine on a 21-gauge 1-1/2 inch needle.  The syringe was switched to mixture containing 1 cc's of 40 mg Kenalog and 4 cc's of 0.25% bupivacaine was injected.  Ultrasound was used. Images were obtained in long views showing the injection.     A sterile dressing was applied.  Patient did tolerate procedure well.      ASSESSMENT & PLAN:   Primary osteoarthritis of left knee Acute on chronic in nature.  Limited neuro consider measures thus far. -Counseled on home exercise therapy - injection today  - xray  - could consider PT

## 2021-11-02 DIAGNOSIS — K921 Melena: Secondary | ICD-10-CM | POA: Diagnosis not present

## 2021-11-14 ENCOUNTER — Other Ambulatory Visit: Payer: Self-pay

## 2021-11-14 ENCOUNTER — Encounter (HOSPITAL_BASED_OUTPATIENT_CLINIC_OR_DEPARTMENT_OTHER): Payer: Self-pay | Admitting: *Deleted

## 2021-11-14 DIAGNOSIS — K921 Melena: Secondary | ICD-10-CM | POA: Diagnosis not present

## 2021-11-14 DIAGNOSIS — K922 Gastrointestinal hemorrhage, unspecified: Secondary | ICD-10-CM | POA: Diagnosis not present

## 2021-11-14 LAB — COMPREHENSIVE METABOLIC PANEL
ALT: 14 U/L (ref 0–44)
AST: 19 U/L (ref 15–41)
Albumin: 3 g/dL — ABNORMAL LOW (ref 3.5–5.0)
Alkaline Phosphatase: 44 U/L (ref 38–126)
Anion gap: 8 (ref 5–15)
BUN: 6 mg/dL (ref 6–20)
CO2: 26 mmol/L (ref 22–32)
Calcium: 8.4 mg/dL — ABNORMAL LOW (ref 8.9–10.3)
Chloride: 99 mmol/L (ref 98–111)
Creatinine, Ser: 0.64 mg/dL (ref 0.44–1.00)
GFR, Estimated: >60 mL/min (ref >60)
Glucose, Bld: 129 mg/dL — ABNORMAL HIGH (ref 70–99)
Potassium: 3.2 mmol/L — ABNORMAL LOW (ref 3.5–5.1)
Sodium: 133 mmol/L — ABNORMAL LOW (ref 135–145)
Total Bilirubin: 0.8 mg/dL (ref 0.3–1.2)
Total Protein: 7.7 g/dL (ref 6.5–8.1)

## 2021-11-14 LAB — CBC WITH DIFFERENTIAL/PLATELET
Abs Immature Granulocytes: 0.06 10*3/uL (ref 0.00–0.07)
Basophils Absolute: 0 10*3/uL (ref 0.0–0.1)
Basophils Relative: 0 %
Eosinophils Absolute: 0.6 10*3/uL — ABNORMAL HIGH (ref 0.0–0.5)
Eosinophils Relative: 6 %
HCT: 35.2 % — ABNORMAL LOW (ref 36.0–46.0)
Hemoglobin: 11.7 g/dL — ABNORMAL LOW (ref 12.0–15.0)
Immature Granulocytes: 1 %
Lymphocytes Relative: 16 %
Lymphs Abs: 1.8 10*3/uL (ref 0.7–4.0)
MCH: 25.6 pg — ABNORMAL LOW (ref 26.0–34.0)
MCHC: 33.2 g/dL (ref 30.0–36.0)
MCV: 77 fL — ABNORMAL LOW (ref 80.0–100.0)
Monocytes Absolute: 1.1 10*3/uL — ABNORMAL HIGH (ref 0.1–1.0)
Monocytes Relative: 10 %
Neutro Abs: 7.6 10*3/uL (ref 1.7–7.7)
Neutrophils Relative %: 67 %
Platelets: 316 10*3/uL (ref 150–400)
RBC: 4.57 MIL/uL (ref 3.87–5.11)
RDW: 14.6 % (ref 11.5–15.5)
WBC: 11.2 10*3/uL — ABNORMAL HIGH (ref 4.0–10.5)
nRBC: 0 % (ref 0.0–0.2)

## 2021-11-14 NOTE — ED Triage Notes (Signed)
2 weeks of diarrhea with some blood in her stool. She was seen by her MD and had xrays. She was scheduled for a colonoscopy next week. She was told to come to the ER if the bleeding continues.

## 2021-11-15 ENCOUNTER — Emergency Department (HOSPITAL_BASED_OUTPATIENT_CLINIC_OR_DEPARTMENT_OTHER)
Admission: EM | Admit: 2021-11-15 | Discharge: 2021-11-15 | Disposition: A | Discharge status: Home / Self Care | Payer: BC Managed Care – PPO | Attending: Emergency Medicine | Admitting: Emergency Medicine

## 2021-11-15 DIAGNOSIS — K922 Gastrointestinal hemorrhage, unspecified: Secondary | ICD-10-CM

## 2021-11-15 LAB — URINALYSIS, ROUTINE W REFLEX MICROSCOPIC
Bilirubin Urine: NEGATIVE
Glucose, UA: NEGATIVE mg/dL
Hgb urine dipstick: NEGATIVE
Ketones, ur: 15 mg/dL — AB
Leukocytes,Ua: NEGATIVE
Nitrite: NEGATIVE
Protein, ur: 100 mg/dL — AB
Specific Gravity, Urine: 1.02 (ref 1.005–1.030)
pH: 6 (ref 5.0–8.0)

## 2021-11-15 LAB — URINALYSIS, MICROSCOPIC (REFLEX)

## 2021-11-15 NOTE — ED Notes (Signed)
Patient verbalizes understanding of discharge instructions. Opportunity for questioning and answers were provided. Armband removed by staff, pt discharged from ED. Wheeled out to lobby  

## 2021-11-15 NOTE — ED Notes (Signed)
Pt needed to use restroom, wheeled to bathroom. States she feels dizzy with position changes

## 2021-11-15 NOTE — ED Provider Notes (Signed)
[facility] Provider Note   CSN: [id number] Arrival date & time: 11/14/21  2149     History  No chief complaint on file.   Tracey Blackburn is a 61 y.o. female.  Pt is a 61 yo female presenting for blood in stool. Pt described bright red blood while wiping x 2 weeks. Denies abdominal pain. States her pcp gave her follow up with GI but she has not seen yet and was told to come to ED if bleeding continued. Pt denies any light headedness, dizziness, or sob. Denies blood thinner use.   The history is provided by the patient. No language interpreter was used.      Home Medications Prior to Admission medications   Medication Sig Start Date End Date Taking? Authorizing Provider  cyclobenzaprine (FLEXERIL) 10 MG tablet Take 1 tablet (10 mg total) by mouth at bedtime. 11/22/17   Horald Pollen, MD  naproxen (NAPROSYN) 375 MG tablet Take 1 tablet (375 mg total) by mouth 2 (two) times daily. 11/04/17   Zigmund Gottron, NP  predniSONE (STERAPRED UNI-PAK 21 TAB) 10 MG (21) TBPK tablet Take by mouth daily. Take 6 tabs by mouth daily  for 2 days, then 5 tabs for 2 days, then 4 tabs for 2 days, then 3 tabs for 2 days, 2 tabs for 2 days, then 1 tab by mouth daily for 2 days 11/04/17   Zigmund Gottron, NP      Allergies    Patient has no known allergies.    Review of Systems   Review of Systems  Constitutional:  Negative for chills and fever.  HENT:  Negative for ear pain and sore throat.   Eyes:  Negative for pain and visual disturbance.  Respiratory:  Negative for cough and shortness of breath.   Cardiovascular:  Negative for chest pain and palpitations.  Gastrointestinal:  Positive for blood in stool. Negative for abdominal pain and vomiting.  Genitourinary:  Negative for dysuria and hematuria.  Musculoskeletal:  Negative for arthralgias and back pain.  Skin:  Negative for color change and rash.  Neurological:  Negative for seizures and syncope.  All  other systems reviewed and are negative.  Physical Exam Updated Vital Signs BP 120/79    Pulse (!) 103    Temp 99.5 F (37.5 C) (Oral)    Resp 18    Ht 5\' 2"  (1.575 m)    Wt 97.5 kg    SpO2 97%    BMI 39.32 kg/m  Physical Exam Vitals and nursing note reviewed.  Constitutional:      General: She is not in acute distress.    Appearance: She is well-developed.  HENT:     Head: Normocephalic and atraumatic.  Eyes:     Conjunctiva/sclera: Conjunctivae normal.  Cardiovascular:     Rate and Rhythm: Normal rate and regular rhythm.     Heart sounds: No murmur heard. Pulmonary:     Effort: Pulmonary effort is normal. No respiratory distress.     Breath sounds: Normal breath sounds.  Abdominal:     Palpations: Abdomen is soft.     Tenderness: There is no abdominal tenderness.  Musculoskeletal:        General: No swelling.     Cervical back: Neck supple.  Skin:    General: Skin is warm and dry.     Capillary Refill: Capillary refill takes less than 2 seconds.  Neurological:     Mental Status: She is alert.  Psychiatric:        Mood and Affect: Mood normal.    ED Results / Procedures / Treatments   Labs (all labs ordered are listed, but only abnormal results are displayed) Labs Reviewed  CBC WITH DIFFERENTIAL/PLATELET - Abnormal; Notable for the following components:      Result Value   WBC 11.2 (*)    Hemoglobin 11.7 (*)    HCT 35.2 (*)    MCV 77.0 (*)    MCH 25.6 (*)    Monocytes Absolute 1.1 (*)    Eosinophils Absolute 0.6 (*)    All other components within normal limits  COMPREHENSIVE METABOLIC PANEL - Abnormal; Notable for the following components:   Sodium 133 (*)    Potassium 3.2 (*)    Glucose, Bld 129 (*)    Calcium 8.4 (*)    Albumin 3.0 (*)    All other components within normal limits  URINALYSIS, ROUTINE W REFLEX MICROSCOPIC - Abnormal; Notable for the following components:   Color, Urine AMBER (*)    Ketones, ur 15 (*)    Protein, ur 100 (*)    All other  components within normal limits  URINALYSIS, MICROSCOPIC (REFLEX) - Abnormal; Notable for the following components:   Bacteria, UA MANY (*)    All other components within normal limits  OCCULT BLOOD X 1 CARD TO LAB, STOOL    EKG None  Radiology No results found.  Procedures Procedures    Medications Ordered in ED Medications - No data to display  ED Course/ Medical Decision Making/ A&P                           Medical Decision Making Amount and/or Complexity of Data Reviewed Labs: ordered.   3:50 AM 61 yo female presenting for blood in stool. Known GI bleed with f/u with GI. Presenting bc was told to f/u if bleeding continued for hemoglobin check. Pt is Aox3, no acute distress, afebrile, with stable vitals.   Hemoglobin stable. Gross blood visualized in stool. Pt well appearing and in no acute distress. Recommended for follow up with GI  Patient in no distress and overall condition improved here in the ED. Detailed discussions were had with the patient regarding current findings, and need for close f/u with GI. The patient has been instructed to return immediately if the symptoms worsen in any way for re-evaluation. Patient verbalized understanding and is in agreement with current care plan. All questions answered prior to discharge.         Final Clinical Impression(s) / ED Diagnoses Final diagnoses:  Gastrointestinal hemorrhage, unspecified gastrointestinal hemorrhage type    Rx / DC Orders ED Discharge Orders     None         Lianne Cure, DO [id number]

## 2021-11-29 DIAGNOSIS — K625 Hemorrhage of anus and rectum: Secondary | ICD-10-CM | POA: Diagnosis not present

## 2021-11-29 DIAGNOSIS — R634 Abnormal weight loss: Secondary | ICD-10-CM | POA: Diagnosis not present

## 2021-11-30 DIAGNOSIS — R197 Diarrhea, unspecified: Secondary | ICD-10-CM | POA: Diagnosis not present

## 2021-11-30 DIAGNOSIS — K625 Hemorrhage of anus and rectum: Secondary | ICD-10-CM | POA: Diagnosis not present

## 2021-12-07 DIAGNOSIS — K5289 Other specified noninfective gastroenteritis and colitis: Secondary | ICD-10-CM | POA: Diagnosis not present

## 2021-12-07 DIAGNOSIS — K625 Hemorrhage of anus and rectum: Secondary | ICD-10-CM | POA: Diagnosis not present

## 2021-12-07 DIAGNOSIS — K6389 Other specified diseases of intestine: Secondary | ICD-10-CM | POA: Diagnosis not present

## 2021-12-07 DIAGNOSIS — K633 Ulcer of intestine: Secondary | ICD-10-CM | POA: Diagnosis not present

## 2021-12-07 DIAGNOSIS — K529 Noninfective gastroenteritis and colitis, unspecified: Secondary | ICD-10-CM | POA: Diagnosis not present

## 2021-12-07 DIAGNOSIS — K648 Other hemorrhoids: Secondary | ICD-10-CM | POA: Diagnosis not present

## 2021-12-07 DIAGNOSIS — D128 Benign neoplasm of rectum: Secondary | ICD-10-CM | POA: Diagnosis not present

## 2022-01-04 DIAGNOSIS — K5190 Ulcerative colitis, unspecified, without complications: Secondary | ICD-10-CM | POA: Diagnosis not present

## 2022-01-05 DIAGNOSIS — K5190 Ulcerative colitis, unspecified, without complications: Secondary | ICD-10-CM | POA: Diagnosis not present
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
## 2022-02-02 DIAGNOSIS — K5190 Ulcerative colitis, unspecified, without complications: Secondary | ICD-10-CM | POA: Diagnosis not present

## 2022-04-12 DIAGNOSIS — K5100 Ulcerative (chronic) pancolitis without complications: Secondary | ICD-10-CM | POA: Diagnosis not present

## 2022-06-06 DIAGNOSIS — K5190 Ulcerative colitis, unspecified, without complications: Secondary | ICD-10-CM | POA: Diagnosis not present

## 2022-06-06 LAB — LAB REPORT - SCANNED: EGFR: 100

## 2022-08-28 ENCOUNTER — Emergency Department (HOSPITAL_BASED_OUTPATIENT_CLINIC_OR_DEPARTMENT_OTHER)
Admission: EM | Admit: 2022-08-28 | Discharge: 2022-08-28 | Disposition: A | Discharge status: Home / Self Care | Payer: PRIVATE HEALTH INSURANCE | Attending: Emergency Medicine | Admitting: Emergency Medicine

## 2022-08-28 ENCOUNTER — Emergency Department (HOSPITAL_BASED_OUTPATIENT_CLINIC_OR_DEPARTMENT_OTHER): Payer: PRIVATE HEALTH INSURANCE

## 2022-08-28 ENCOUNTER — Other Ambulatory Visit: Payer: Self-pay

## 2022-08-28 ENCOUNTER — Encounter (HOSPITAL_BASED_OUTPATIENT_CLINIC_OR_DEPARTMENT_OTHER): Payer: Self-pay | Admitting: Emergency Medicine

## 2022-08-28 DIAGNOSIS — I10 Essential (primary) hypertension: Secondary | ICD-10-CM | POA: Diagnosis not present

## 2022-08-28 DIAGNOSIS — Y929 Unspecified place or not applicable: Secondary | ICD-10-CM | POA: Diagnosis not present

## 2022-08-28 DIAGNOSIS — Y939 Activity, unspecified: Secondary | ICD-10-CM | POA: Insufficient documentation

## 2022-08-28 DIAGNOSIS — Y998 Other external cause status: Secondary | ICD-10-CM | POA: Diagnosis not present

## 2022-08-28 DIAGNOSIS — Z043 Encounter for examination and observation following other accident: Secondary | ICD-10-CM | POA: Diagnosis not present

## 2022-08-28 DIAGNOSIS — W010XXA Fall on same level from slipping, tripping and stumbling without subsequent striking against object, initial encounter: Secondary | ICD-10-CM | POA: Insufficient documentation

## 2022-08-28 DIAGNOSIS — S300XXA Contusion of lower back and pelvis, initial encounter: Secondary | ICD-10-CM | POA: Insufficient documentation

## 2022-08-28 DIAGNOSIS — E119 Type 2 diabetes mellitus without complications: Secondary | ICD-10-CM | POA: Diagnosis not present

## 2022-08-28 DIAGNOSIS — Y999 Unspecified external cause status: Secondary | ICD-10-CM | POA: Insufficient documentation

## 2022-08-28 DIAGNOSIS — M533 Sacrococcygeal disorders, not elsewhere classified: Secondary | ICD-10-CM | POA: Diagnosis not present

## 2022-08-28 MED ORDER — HYDROCODONE-ACETAMINOPHEN 5-325 MG PO TABS
1.0000 | ORAL_TABLET | Freq: Once | ORAL | Status: AC
  Administered 2022-08-28: 1 via ORAL
  Filled 2022-08-28: qty 1

## 2022-08-28 NOTE — ED Provider Notes (Signed)
[facility] Provider Note   CSN: [id number] Arrival date & time: 08/28/22  0001     History  Chief Complaint  Patient presents with   Fall    Tracey Blackburn is a 61 y.o. female.  The history is provided by the patient.  Fall  She has a history of hypertension, diabetes and comes in following a fall.  She was at work when she slipped on a wet floor and hit her tailbone.  She is complaining of pain in her tailbone and denies other injury.   Home Medications Prior to Admission medications   Medication Sig Start Date End Date Taking? Authorizing Provider  cyclobenzaprine (FLEXERIL) 10 MG tablet Take 1 tablet (10 mg total) by mouth at bedtime. 11/22/17   Georgina Quint, MD  naproxen (NAPROSYN) 375 MG tablet Take 1 tablet (375 mg total) by mouth 2 (two) times daily. 11/04/17   Georgetta Haber, NP  predniSONE (STERAPRED UNI-PAK 21 TAB) 10 MG (21) TBPK tablet Take by mouth daily. Take 6 tabs by mouth daily  for 2 days, then 5 tabs for 2 days, then 4 tabs for 2 days, then 3 tabs for 2 days, 2 tabs for 2 days, then 1 tab by mouth daily for 2 days 11/04/17   Georgetta Haber, NP      Allergies    Patient has no known allergies.    Review of Systems   Review of Systems  All other systems reviewed and are negative.   Physical Exam Updated Vital Signs BP 136/65 (BP Location: Right Arm)   Pulse 91   Temp 97.8 F (36.6 C)   Resp 18   Ht 5\' 2"  (1.575 m)   Wt 95.3 kg   SpO2 100%   BMI 38.41 kg/m  Physical Exam Vitals and nursing note reviewed.   61 year old female, resting comfortably and in no acute distress. Vital signs are normal. Oxygen saturation is 100%, which is normal. Head is normocephalic and atraumatic. PERRLA, EOMI. Oropharynx is clear. Neck is nontender and supple without adenopathy or JVD. Back is nontender and there is no CVA tenderness. Lungs are clear without rales, wheezes, or rhonchi. Chest is nontender. Heart has regular  rate and rhythm without murmur. Abdomen is soft, flat, nontender. Pelvis is stable and nontender.  There is tenderness to palpation over the lower sacrum and coccyx. Extremities have no cyanosis or edema, full range of motion is present. Skin is warm and dry without rash. Neurologic: Mental status is normal, cranial nerves are intact, moves all extremities equally.  ED Results / Procedures / Treatments    Radiology CT PELVIS WO CONTRAST  Result Date: 08/28/2022 CLINICAL DATA:  61 year old female status post fall on buttocks. Possible pelvic fracture. EXAM: CT PELVIS WITHOUT CONTRAST TECHNIQUE: Multidetector CT imaging of the pelvis was performed following the standard protocol without intravenous contrast. RADIATION DOSE REDUCTION: This exam was performed according to the departmental dose-optimization program which includes automated exposure control, adjustment of the mA and/or kV according to patient size and/or use of iterative reconstruction technique. COMPARISON:  Sacrococcygeal radiographs 0026 hours today. Previous CT Abdomen and Pelvis 10/26/2017. FINDINGS: Urinary Tract: Kidneys not included. Decompressed distal ureters. Unremarkable bladder. Bowel: Nondilated visible small and large bowel. No mesenteric inflammation identified. Vascular/Lymphatic: Vascular patency is not evaluated in the absence of IV contrast. No inguinal or pelvic sidewall lymphadenopathy. Small but increased perirectal lymph nodes since 2019, 5-6 mm short axis. No discrete rectal abnormality. Reproductive:  Negative noncontrast appearance. Other:  No pelvis free fluid or presacral stranding/edema. Musculoskeletal: Lower lumbar mild to moderate facet degeneration with trace vacuum facet on the right. Sacral ala and SI joints appear stable and intact, with left side assimilation joint redemonstrated (normal variant). On the lateral view the lower sacral and coccygeal segments appear stable since 2019, nondisplaced. Iliac  bones, pubic rami, acetabula and proximal femurs appear intact. No acute osseous abnormality identified. No superficial soft tissue injury identified. IMPRESSION: 1. No acute traumatic injury identified about the pelvis. 2. Conspicuous perirectal lymph nodes have increased since 2019 but remain within normal limits. No rectal abnormality is evident. Recommend ensuring up-to-date colon cancer screening for this patient. Electronically Signed   By: Genevie Ann M.D.   On: 08/28/2022 05:38   DG Sacrum/Coccyx  Result Date: 08/28/2022 CLINICAL DATA:  Fall, pain EXAM: SACRUM AND COCCYX - 2+ VIEW COMPARISON:  None Available. FINDINGS: There is no evidence of fracture or other focal bone lesions. IMPRESSION: Negative. Electronically Signed   By: Rolm Baptise M.D.   On: 08/28/2022 00:45    Procedures Procedures    Medications Ordered in ED Medications  HYDROcodone-acetaminophen (NORCO/VICODIN) 5-325 MG per tablet 1 tablet (has no administration in time range)    ED Course/ Medical Decision Making/ A&P                           Medical Decision Making Amount and/or Complexity of Data Reviewed Radiology: ordered.  Risk Prescription drug management.   Fall with tailbone injury.  X-rays show no evidence of fracture.  I have independently viewed those images, and agree with the radiologist's interpretation.  However, clinically I am concerned about sacrum/coccyx fracture and I have ordered CT of pelvis to further evaluate this.  CT scan shows no fracture.  I am discharging patient with instructions to apply ice, get a donut cushion to use when sitting.  I have recommended she use over-the-counter NSAIDs and acetaminophen as needed for pain.  Final Clinical Impression(s) / ED Diagnoses Final diagnoses:  Fall from slipping, initial encounter  Contusion of coccyx, initial encounter    Rx / DC Orders ED Discharge Orders     None         Delora Fuel, MD [phone]

## 2022-08-28 NOTE — ED Triage Notes (Signed)
Pt states she was at work and fell on her buttocks  Pt states her feet got tangled up in the dust mop  Pt states it hurts to sit

## 2022-08-28 NOTE — Discharge Instructions (Addendum)
Apply ice for 30 minutes at a time, 4 times a day.  Take ibuprofen or naproxen as needed for pain.  If you need additional pain relief, add acetaminophen.  Use a doughnut cushion to take pressure off of your tailbone when you are sitting.

## 2022-12-05 ENCOUNTER — Emergency Department (HOSPITAL_BASED_OUTPATIENT_CLINIC_OR_DEPARTMENT_OTHER): Payer: BC Managed Care – PPO

## 2022-12-05 ENCOUNTER — Other Ambulatory Visit: Payer: Self-pay

## 2022-12-05 ENCOUNTER — Encounter (HOSPITAL_BASED_OUTPATIENT_CLINIC_OR_DEPARTMENT_OTHER): Payer: Self-pay | Admitting: Emergency Medicine

## 2022-12-05 ENCOUNTER — Emergency Department (HOSPITAL_BASED_OUTPATIENT_CLINIC_OR_DEPARTMENT_OTHER)
Admission: EM | Admit: 2022-12-05 | Discharge: 2022-12-05 | Disposition: A | Discharge status: Home / Self Care | Payer: BC Managed Care – PPO | Attending: Emergency Medicine | Admitting: Emergency Medicine

## 2022-12-05 ENCOUNTER — Ambulatory Visit
Admission: EM | Admit: 2022-12-05 | Discharge: 2022-12-05 | Disposition: A | Discharge status: Home / Self Care | Payer: BC Managed Care – PPO | Attending: Internal Medicine | Admitting: Internal Medicine

## 2022-12-05 DIAGNOSIS — D649 Anemia, unspecified: Secondary | ICD-10-CM | POA: Insufficient documentation

## 2022-12-05 DIAGNOSIS — B349 Viral infection, unspecified: Secondary | ICD-10-CM

## 2022-12-05 DIAGNOSIS — I10 Essential (primary) hypertension: Secondary | ICD-10-CM | POA: Diagnosis not present

## 2022-12-05 DIAGNOSIS — E119 Type 2 diabetes mellitus without complications: Secondary | ICD-10-CM | POA: Insufficient documentation

## 2022-12-05 DIAGNOSIS — J069 Acute upper respiratory infection, unspecified: Secondary | ICD-10-CM | POA: Diagnosis not present

## 2022-12-05 DIAGNOSIS — Z1152 Encounter for screening for COVID-19: Secondary | ICD-10-CM | POA: Insufficient documentation

## 2022-12-05 DIAGNOSIS — E876 Hypokalemia: Secondary | ICD-10-CM | POA: Insufficient documentation

## 2022-12-05 DIAGNOSIS — B9789 Other viral agents as the cause of diseases classified elsewhere: Secondary | ICD-10-CM | POA: Diagnosis not present

## 2022-12-05 DIAGNOSIS — R000 Tachycardia, unspecified: Secondary | ICD-10-CM | POA: Diagnosis not present

## 2022-12-05 DIAGNOSIS — R509 Fever, unspecified: Secondary | ICD-10-CM | POA: Diagnosis not present

## 2022-12-05 DIAGNOSIS — R059 Cough, unspecified: Secondary | ICD-10-CM | POA: Diagnosis not present

## 2022-12-05 LAB — RESP PANEL BY RT-PCR (RSV, FLU A&B, COVID)  RVPGX2
Influenza A by PCR: NEGATIVE
Influenza B by PCR: NEGATIVE
Resp Syncytial Virus by PCR: NEGATIVE
SARS Coronavirus 2 by RT PCR: NEGATIVE

## 2022-12-05 LAB — CBC WITH DIFFERENTIAL/PLATELET
Abs Immature Granulocytes: 0.06 10*3/uL (ref 0.00–0.07)
Basophils Absolute: 0 10*3/uL (ref 0.0–0.1)
Basophils Relative: 0 %
Eosinophils Absolute: 0.4 10*3/uL (ref 0.0–0.5)
Eosinophils Relative: 3 %
HCT: 31.2 % — ABNORMAL LOW (ref 36.0–46.0)
Hemoglobin: 9.9 g/dL — ABNORMAL LOW (ref 12.0–15.0)
Immature Granulocytes: 1 %
Lymphocytes Relative: 16 %
Lymphs Abs: 1.7 10*3/uL (ref 0.7–4.0)
MCH: 23 pg — ABNORMAL LOW (ref 26.0–34.0)
MCHC: 31.7 g/dL (ref 30.0–36.0)
MCV: 72.4 fL — ABNORMAL LOW (ref 80.0–100.0)
Monocytes Absolute: 1.4 10*3/uL — ABNORMAL HIGH (ref 0.1–1.0)
Monocytes Relative: 13 %
Neutro Abs: 7.2 10*3/uL (ref 1.7–7.7)
Neutrophils Relative %: 67 %
Platelets: 399 10*3/uL (ref 150–400)
RBC: 4.31 MIL/uL (ref 3.87–5.11)
RDW: 17.6 % — ABNORMAL HIGH (ref 11.5–15.5)
WBC: 10.8 10*3/uL — ABNORMAL HIGH (ref 4.0–10.5)
nRBC: 0 % (ref 0.0–0.2)

## 2022-12-05 LAB — COMPREHENSIVE METABOLIC PANEL
ALT: 15 U/L (ref 0–44)
AST: 23 U/L (ref 15–41)
Albumin: 3 g/dL — ABNORMAL LOW (ref 3.5–5.0)
Alkaline Phosphatase: 49 U/L (ref 38–126)
Anion gap: 9 (ref 5–15)
BUN: 9 mg/dL (ref 8–23)
CO2: 22 mmol/L (ref 22–32)
Calcium: 8.3 mg/dL — ABNORMAL LOW (ref 8.9–10.3)
Chloride: 100 mmol/L (ref 98–111)
Creatinine, Ser: 0.97 mg/dL (ref 0.44–1.00)
GFR, Estimated: >60 mL/min (ref >60)
Glucose, Bld: 112 mg/dL — ABNORMAL HIGH (ref 70–99)
Potassium: 3.2 mmol/L — ABNORMAL LOW (ref 3.5–5.1)
Sodium: 131 mmol/L — ABNORMAL LOW (ref 135–145)
Total Bilirubin: 0.7 mg/dL (ref 0.3–1.2)
Total Protein: 8 g/dL (ref 6.5–8.1)

## 2022-12-05 LAB — LACTIC ACID, PLASMA: Lactic Acid, Venous: 1.2 mmol/L (ref 0.5–1.9)

## 2022-12-05 MED ORDER — ACETAMINOPHEN 500 MG PO TABS
1000.0000 mg | ORAL_TABLET | Freq: Once | ORAL | Status: AC
  Administered 2022-12-05: 1000 mg via ORAL
  Filled 2022-12-05: qty 2

## 2022-12-05 MED ORDER — SODIUM CHLORIDE 0.9 % IV BOLUS
1000.0000 mL | Freq: Once | INTRAVENOUS | Status: AC
  Administered 2022-12-05: 1000 mL via INTRAVENOUS

## 2022-12-05 MED ORDER — POTASSIUM CHLORIDE CRYS ER 20 MEQ PO TBCR
40.0000 meq | EXTENDED_RELEASE_TABLET | Freq: Once | ORAL | Status: AC
  Administered 2022-12-05: 40 meq via ORAL
  Filled 2022-12-05: qty 2

## 2022-12-05 NOTE — Discharge Instructions (Signed)
You can take Tylenol as needed for fevers.  Your potassium and hemoglobin were low.  You have been given a dose of potassium replacement.  Make sure that you follow-up with your primary care doctor to have these values rechecked.  Make sure that you are staying hydrated.  Return to the emergency room if you have any worsening symptoms.

## 2022-12-05 NOTE — ED Notes (Signed)
Patient is being discharged from the Urgent Care and sent to the Emergency Department via self . Per haley, patient is in need of higher level of care due to abnormal ecg. Patient is aware and verbalizes understanding of plan of care.  Vitals:   12/05/22 1703  BP: 126/65  Pulse: (!) 125  Resp: 16  Temp: 98 F (36.7 C)  SpO2: 96%

## 2022-12-05 NOTE — Discharge Instructions (Signed)
Go to the emergency department as soon as you leave urgent care for further evaluation and management. 
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
## 2022-12-05 NOTE — ED Provider Notes (Signed)
Tracey Blackburn   CSN: [id number] Arrival date & time: 12/05/22  1855     History  Chief Complaint  Patient presents with   Tachycardia    Tracey Blackburn is a 62 y.o. female.  Patient is a 62 year old female with a history of diabetes and hypertension who presents with cough and cold symptoms.  She states they started 5 days ago with runny nose congestion and fatigue.  Had some associated diarrhea.  She has had nausea but no vomiting.  She has some intermittent crampy abdominal pain.  She has had some fevers that started in the last day or 2.  She is overall felt weaker than normal.  She was seen in urgent care and was noted to be febrile and tachycardic and was sent here for further evaluation.  She denies any shortness of breath.  She has a little bit of a cough.  She does work at [employer].       Home Medications Prior to Admission medications   Not on File      Allergies    Patient has no known allergies.    Review of Systems   Review of Systems  Constitutional:  Positive for fatigue and fever. Negative for chills and diaphoresis.  HENT:  Positive for congestion and rhinorrhea. Negative for sneezing.   Eyes: Negative.   Respiratory:  Positive for cough. Negative for chest tightness and shortness of breath.   Cardiovascular:  Negative for chest pain and leg swelling.  Gastrointestinal:  Positive for abdominal pain, diarrhea and nausea. Negative for blood in stool and vomiting.  Genitourinary:  Negative for difficulty urinating, flank pain, frequency and hematuria.  Musculoskeletal:  Negative for arthralgias and back pain.  Skin:  Negative for rash.  Neurological:  Negative for dizziness, speech difficulty, weakness, numbness and headaches.    Physical Exam Updated Vital Signs BP (!) 114/41   Pulse 96   Temp 98.8 F (37.1 C) (Oral)   Resp 18   Ht '5\' 2"'$  (1.575 m)   Wt 88.2 kg   SpO2 94%   BMI 35.57 kg/m   Physical Exam Constitutional:      Appearance: She is well-developed.  HENT:     Head: Normocephalic and atraumatic.     Nose: Rhinorrhea present.  Eyes:     Pupils: Pupils are equal, round, and reactive to light.  Cardiovascular:     Rate and Rhythm: Regular rhythm. Tachycardia present.     Heart sounds: Normal heart sounds.  Pulmonary:     Effort: Pulmonary effort is normal. No respiratory distress.     Breath sounds: Normal breath sounds. No wheezing or rales.  Chest:     Chest wall: No tenderness.  Abdominal:     General: Bowel sounds are normal.     Palpations: Abdomen is soft.     Tenderness: There is no abdominal tenderness. There is no guarding or rebound.  Musculoskeletal:        General: Normal range of motion.     Cervical back: Normal range of motion and neck supple.  Lymphadenopathy:     Cervical: No cervical adenopathy.  Skin:    General: Skin is warm and dry.     Findings: No rash.  Neurological:     Mental Status: She is alert and oriented to person, place, and time.     ED Results / Procedures / Treatments   Labs (all labs ordered are listed, but only  abnormal results are displayed) Labs Reviewed  COMPREHENSIVE METABOLIC PANEL - Abnormal; Notable for the following components:      Result Value   Sodium 131 (*)    Potassium 3.2 (*)    Glucose, Bld 112 (*)    Calcium 8.3 (*)    Albumin 3.0 (*)    All other components within normal limits  CBC WITH DIFFERENTIAL/PLATELET - Abnormal; Notable for the following components:   WBC 10.8 (*)    Hemoglobin 9.9 (*)    HCT 31.2 (*)    MCV 72.4 (*)    MCH 23.0 (*)    RDW 17.6 (*)    Monocytes Absolute 1.4 (*)    All other components within normal limits  RESP PANEL BY RT-PCR (RSV, FLU A&B, COVID)  RVPGX2  LACTIC ACID, PLASMA    EKG EKG Interpretation  Date/Time:  Wednesday December 05 2022 19:08:09 EDT Ventricular Rate:  128 PR Interval:  146 QRS Duration: 101 QT Interval:  303 QTC  Calculation: 443 R Axis:   -16 Text Interpretation: Sinus tachycardia Consider right atrial enlargement Borderline left axis deviation Consider right ventricular hypertrophy Confirmed by Malvin Johns [phone]) on 12/05/2022 8:46:42 PM  Radiology DG Chest 2 View  Result Date: 12/05/2022 CLINICAL DATA:  Cough and fever. EXAM: CHEST - 2 VIEW COMPARISON:  None Available. FINDINGS: The lungs are clear without focal pneumonia, edema, pneumothorax or pleural effusion. The cardiopericardial silhouette is within normal limits for size. The visualized bony structures of the thorax are unremarkable. IMPRESSION: No active cardiopulmonary disease. Electronically Signed   By: Misty Stanley M.D.   On: 12/05/2022 21:25    Procedures Procedures    Medications Ordered in ED Medications  potassium chloride SA (KLOR-CON M) CR tablet 40 mEq (has no administration in time range)  acetaminophen (TYLENOL) tablet 1,000 mg (1,000 mg Oral Given 12/05/22 1910)  sodium chloride 0.9 % bolus 1,000 mL (1,000 mLs Intravenous Bolus 12/05/22 2154)    ED Course/ Medical Decision Making/ A&P                             Medical Decision Making Amount and/or Complexity of Data Reviewed Labs: ordered. Radiology: ordered.  Risk OTC drugs. Prescription drug management.   Patient is a 62 year old female who presents with URI symptoms and cough.  No shortness of breath.  She has noted to be febrile and tachycardic.  She has some generalized weakness.  No vomiting.  She has had some loose stools.  She had a chest x-ray two-view which was interpreted by me and confirmed by the radiologist to show no evidence of pneumonia.  No other acute abnormality.  Her COVID/flu test is negative.  Her labs show an anemia with a hemoglobin of 9.9 which is a bit lower than her baseline values based on chart review.  Although her last value was about a year ago.  Her potassium is also low at 3.1.  She was given a dose of potassium replacement.   She was given IV fluids and Tylenol for her fever.  She looks much better after this.  Her heart rate has normalized.  She is nontoxic-appearing.  There is no clinical suggestions of pneumonia or meningitis.  No suggestions of sepsis.  She was discharged home in good condition.  She is happy alert and talkative.  She was advised on symptomatic care.  She was advised that she needs to follow-up with her primary care doctor  to have her lab values rechecked.  Return precautions were given.  Final Clinical Impression(s) / ED Diagnoses Final diagnoses:  Viral URI  Anemia, unspecified type  Hypokalemia    Rx / DC Orders ED Discharge Orders     None         Malvin Johns, MD 12/05/22 2249

## 2022-12-05 NOTE — ED Triage Notes (Signed)
Pt c/o URI/flu-like sx since Saturday. Sent here from [facility] due to elevated HR. Pt febrile in triage. Last dose of tylenol this morning.

## 2022-12-05 NOTE — ED Triage Notes (Signed)
Pt c/o cough, sore throat, nasal congestion, ear aches, headaches, diarrhea  Onset ~ sat

## 2022-12-10 ENCOUNTER — Encounter (HOSPITAL_COMMUNITY): Payer: Self-pay

## 2022-12-10 ENCOUNTER — Emergency Department (HOSPITAL_COMMUNITY): Payer: BC Managed Care – PPO

## 2022-12-10 ENCOUNTER — Inpatient Hospital Stay (HOSPITAL_COMMUNITY)
Admission: EM | Admit: 2022-12-10 | Discharge: 2022-12-17 | DRG: 153 | Disposition: A | Discharge status: Home / Self Care | Payer: BC Managed Care – PPO | Attending: Internal Medicine | Admitting: Internal Medicine

## 2022-12-10 ENCOUNTER — Other Ambulatory Visit: Payer: Self-pay

## 2022-12-10 DIAGNOSIS — D509 Iron deficiency anemia, unspecified: Secondary | ICD-10-CM | POA: Diagnosis present

## 2022-12-10 DIAGNOSIS — R933 Abnormal findings on diagnostic imaging of other parts of digestive tract: Secondary | ICD-10-CM | POA: Diagnosis not present

## 2022-12-10 DIAGNOSIS — I10 Essential (primary) hypertension: Secondary | ICD-10-CM | POA: Diagnosis present

## 2022-12-10 DIAGNOSIS — R197 Diarrhea, unspecified: Secondary | ICD-10-CM | POA: Diagnosis not present

## 2022-12-10 DIAGNOSIS — J40 Bronchitis, not specified as acute or chronic: Principal | ICD-10-CM

## 2022-12-10 DIAGNOSIS — E871 Hypo-osmolality and hyponatremia: Secondary | ICD-10-CM | POA: Diagnosis present

## 2022-12-10 DIAGNOSIS — E860 Dehydration: Secondary | ICD-10-CM | POA: Diagnosis present

## 2022-12-10 DIAGNOSIS — J209 Acute bronchitis, unspecified: Secondary | ICD-10-CM | POA: Diagnosis present

## 2022-12-10 DIAGNOSIS — D72829 Elevated white blood cell count, unspecified: Secondary | ICD-10-CM

## 2022-12-10 DIAGNOSIS — D473 Essential (hemorrhagic) thrombocythemia: Secondary | ICD-10-CM | POA: Diagnosis not present

## 2022-12-10 DIAGNOSIS — R6510 Systemic inflammatory response syndrome (SIRS) of non-infectious origin without acute organ dysfunction: Secondary | ICD-10-CM | POA: Diagnosis not present

## 2022-12-10 DIAGNOSIS — R5381 Other malaise: Secondary | ICD-10-CM | POA: Diagnosis present

## 2022-12-10 DIAGNOSIS — R5383 Other fatigue: Secondary | ICD-10-CM | POA: Diagnosis present

## 2022-12-10 DIAGNOSIS — E876 Hypokalemia: Secondary | ICD-10-CM | POA: Diagnosis not present

## 2022-12-10 DIAGNOSIS — Z8249 Family history of ischemic heart disease and other diseases of the circulatory system: Secondary | ICD-10-CM | POA: Diagnosis not present

## 2022-12-10 DIAGNOSIS — K51011 Ulcerative (chronic) pancolitis with rectal bleeding: Secondary | ICD-10-CM | POA: Diagnosis not present

## 2022-12-10 DIAGNOSIS — R7982 Elevated C-reactive protein (CRP): Secondary | ICD-10-CM | POA: Diagnosis not present

## 2022-12-10 DIAGNOSIS — D62 Acute posthemorrhagic anemia: Secondary | ICD-10-CM | POA: Diagnosis not present

## 2022-12-10 DIAGNOSIS — Z1152 Encounter for screening for COVID-19: Secondary | ICD-10-CM | POA: Diagnosis not present

## 2022-12-10 DIAGNOSIS — Z833 Family history of diabetes mellitus: Secondary | ICD-10-CM

## 2022-12-10 DIAGNOSIS — Z79899 Other long term (current) drug therapy: Secondary | ICD-10-CM

## 2022-12-10 DIAGNOSIS — R000 Tachycardia, unspecified: Secondary | ICD-10-CM | POA: Diagnosis not present

## 2022-12-10 DIAGNOSIS — D649 Anemia, unspecified: Secondary | ICD-10-CM

## 2022-12-10 DIAGNOSIS — I472 Ventricular tachycardia, unspecified: Secondary | ICD-10-CM | POA: Diagnosis not present

## 2022-12-10 DIAGNOSIS — E119 Type 2 diabetes mellitus without complications: Secondary | ICD-10-CM | POA: Diagnosis not present

## 2022-12-10 DIAGNOSIS — R0600 Dyspnea, unspecified: Secondary | ICD-10-CM | POA: Diagnosis present

## 2022-12-10 DIAGNOSIS — E538 Deficiency of other specified B group vitamins: Secondary | ICD-10-CM | POA: Diagnosis present

## 2022-12-10 DIAGNOSIS — D75838 Other thrombocytosis: Secondary | ICD-10-CM | POA: Diagnosis present

## 2022-12-10 DIAGNOSIS — K921 Melena: Secondary | ICD-10-CM | POA: Diagnosis not present

## 2022-12-10 DIAGNOSIS — K6389 Other specified diseases of intestine: Secondary | ICD-10-CM | POA: Diagnosis not present

## 2022-12-10 DIAGNOSIS — Z6835 Body mass index (BMI) 35.0-35.9, adult: Secondary | ICD-10-CM | POA: Diagnosis not present

## 2022-12-10 DIAGNOSIS — R0902 Hypoxemia: Secondary | ICD-10-CM | POA: Diagnosis not present

## 2022-12-10 DIAGNOSIS — K529 Noninfective gastroenteritis and colitis, unspecified: Secondary | ICD-10-CM | POA: Diagnosis not present

## 2022-12-10 DIAGNOSIS — Z86010 Personal history of colonic polyps: Secondary | ICD-10-CM

## 2022-12-10 DIAGNOSIS — R051 Acute cough: Secondary | ICD-10-CM

## 2022-12-10 DIAGNOSIS — D75839 Thrombocytosis, unspecified: Secondary | ICD-10-CM

## 2022-12-10 DIAGNOSIS — J069 Acute upper respiratory infection, unspecified: Secondary | ICD-10-CM | POA: Diagnosis not present

## 2022-12-10 DIAGNOSIS — K51013 Ulcerative (chronic) pancolitis with fistula: Secondary | ICD-10-CM | POA: Diagnosis not present

## 2022-12-10 DIAGNOSIS — K5100 Ulcerative (chronic) pancolitis without complications: Secondary | ICD-10-CM | POA: Diagnosis present

## 2022-12-10 DIAGNOSIS — R0602 Shortness of breath: Secondary | ICD-10-CM | POA: Diagnosis not present

## 2022-12-10 DIAGNOSIS — R062 Wheezing: Secondary | ICD-10-CM | POA: Diagnosis not present

## 2022-12-10 DIAGNOSIS — R059 Cough, unspecified: Secondary | ICD-10-CM | POA: Diagnosis not present

## 2022-12-10 LAB — COMPREHENSIVE METABOLIC PANEL
ALT: 10 U/L (ref 0–44)
AST: 17 U/L (ref 15–41)
Albumin: 2.8 g/dL — ABNORMAL LOW (ref 3.5–5.0)
Alkaline Phosphatase: 56 U/L (ref 38–126)
Anion gap: 8 (ref 5–15)
BUN: 7 mg/dL — ABNORMAL LOW (ref 8–23)
CO2: 22 mmol/L (ref 22–32)
Calcium: 8 mg/dL — ABNORMAL LOW (ref 8.9–10.3)
Chloride: 101 mmol/L (ref 98–111)
Creatinine, Ser: 0.71 mg/dL (ref 0.44–1.00)
GFR, Estimated: >60 mL/min (ref >60)
Glucose, Bld: 136 mg/dL — ABNORMAL HIGH (ref 70–99)
Potassium: 3.2 mmol/L — ABNORMAL LOW (ref 3.5–5.1)
Sodium: 131 mmol/L — ABNORMAL LOW (ref 135–145)
Total Bilirubin: 0.7 mg/dL (ref 0.3–1.2)
Total Protein: 8 g/dL (ref 6.5–8.1)

## 2022-12-10 LAB — RESP PANEL BY RT-PCR (RSV, FLU A&B, COVID)  RVPGX2
Influenza A by PCR: NEGATIVE
Influenza B by PCR: NEGATIVE
Resp Syncytial Virus by PCR: NEGATIVE
SARS Coronavirus 2 by RT PCR: NEGATIVE

## 2022-12-10 LAB — CBC WITH DIFFERENTIAL/PLATELET
Abs Immature Granulocytes: 0.06 10*3/uL (ref 0.00–0.07)
Basophils Absolute: 0 10*3/uL (ref 0.0–0.1)
Basophils Relative: 0 %
Eosinophils Absolute: 0.3 10*3/uL (ref 0.0–0.5)
Eosinophils Relative: 3 %
HCT: 30.8 % — ABNORMAL LOW (ref 36.0–46.0)
Hemoglobin: 9.5 g/dL — ABNORMAL LOW (ref 12.0–15.0)
Immature Granulocytes: 1 %
Lymphocytes Relative: 16 %
Lymphs Abs: 1.9 10*3/uL (ref 0.7–4.0)
MCH: 22.8 pg — ABNORMAL LOW (ref 26.0–34.0)
MCHC: 30.8 g/dL (ref 30.0–36.0)
MCV: 74 fL — ABNORMAL LOW (ref 80.0–100.0)
Monocytes Absolute: 1.1 10*3/uL — ABNORMAL HIGH (ref 0.1–1.0)
Monocytes Relative: 9 %
Neutro Abs: 8.9 10*3/uL — ABNORMAL HIGH (ref 1.7–7.7)
Neutrophils Relative %: 71 %
Platelets: 464 10*3/uL — ABNORMAL HIGH (ref 150–400)
RBC: 4.16 MIL/uL (ref 3.87–5.11)
RDW: 17.7 % — ABNORMAL HIGH (ref 11.5–15.5)
WBC: 12.3 10*3/uL — ABNORMAL HIGH (ref 4.0–10.5)
nRBC: 0 % (ref 0.0–0.2)

## 2022-12-10 LAB — TROPONIN I (HIGH SENSITIVITY): Troponin I (High Sensitivity): 21 ng/L — ABNORMAL HIGH (ref <18)

## 2022-12-10 LAB — BRAIN NATRIURETIC PEPTIDE: B Natriuretic Peptide: 23.4 pg/mL (ref 0.0–100.0)

## 2022-12-10 MED ORDER — LACTATED RINGERS IV BOLUS (SEPSIS)
1000.0000 mL | Freq: Once | INTRAVENOUS | Status: AC
  Administered 2022-12-10: 1000 mL via INTRAVENOUS

## 2022-12-10 MED ORDER — LACTATED RINGERS IV SOLN: INTRAVENOUS | Status: DC

## 2022-12-10 MED ORDER — SODIUM CHLORIDE 0.9 % IV SOLN
500.0000 mg | INTRAVENOUS | Status: DC
  Administered 2022-12-11: 500 mg via INTRAVENOUS
  Filled 2022-12-10: qty 5

## 2022-12-10 MED ORDER — SODIUM CHLORIDE 0.9 % IV SOLN
2.0000 g | INTRAVENOUS | Status: DC
  Administered 2022-12-10: 2 g via INTRAVENOUS
  Filled 2022-12-10: qty 20

## 2022-12-10 MED ORDER — ALBUTEROL SULFATE HFA 108 (90 BASE) MCG/ACT IN AERS
2.0000 | INHALATION_SPRAY | Freq: Once | RESPIRATORY_TRACT | Status: AC
  Administered 2022-12-10: 2 via RESPIRATORY_TRACT
  Filled 2022-12-10: qty 6.7

## 2022-12-10 NOTE — ED Provider Notes (Signed)
[place] EMERGENCY DEPARTMENT AT [facility] Provider Note   CSN: [id number] Arrival date & time: 12/10/22  2206     History  Chief Complaint  Patient presents with   Shortness of Breath    Tracey Blackburn is a 62 y.o. female.   Shortness of Breath    62 year old female with medical history significant for DM2, hypertension, recent emergency department visit on 12/05/2022, diagnosed with viral URI at that time and unspecified tachycardia who presents to the emergency department with shortness of breath, palpitations and diaphoresis.  She states that she has had worsening productive cough and labored breathing, difficulty laying flat.  She denies any lower extremity swelling.  She denies any chest pain.  She denies any fevers.  She endorses worsening dyspnea, orthopnea, continued cough.  Home Medications Prior to Admission medications   Not on File      Allergies    Patient has no known allergies.    Review of Systems   Review of Systems  Respiratory:  Positive for shortness of breath.   All other systems reviewed and are negative.   Physical Exam Updated Vital Signs BP 131/70   Pulse (!) 132   Temp 98.3 F (36.8 C) (Oral)   Resp (!) 26   Ht 5\' 2"  (1.575 m)   Wt 88.2 kg   SpO2 99%   BMI 35.57 kg/m  Physical Exam Vitals and nursing note reviewed.  Constitutional:      General: She is not in acute distress.    Appearance: She is well-developed. She is obese.  HENT:     Head: Normocephalic and atraumatic.  Eyes:     Conjunctiva/sclera: Conjunctivae normal.  Cardiovascular:     Rate and Rhythm: Normal rate and regular rhythm.     Heart sounds: No murmur heard. Pulmonary:     Effort: Pulmonary effort is normal. No respiratory distress.     Breath sounds: Examination of the right-upper field reveals wheezing. Examination of the right-middle field reveals wheezing. Wheezing present.  Abdominal:     Palpations: Abdomen is soft.     Tenderness: There  is no abdominal tenderness.  Musculoskeletal:        General: No swelling.     Cervical back: Neck supple.  Skin:    General: Skin is warm and dry.     Capillary Refill: Capillary refill takes less than 2 seconds.  Neurological:     Mental Status: She is alert.  Psychiatric:        Mood and Affect: Mood normal.     ED Results / Procedures / Treatments   Labs (all labs ordered are listed, but only abnormal results are displayed) Labs Reviewed  RESP PANEL BY RT-PCR (RSV, FLU A&B, COVID)  RVPGX2  CBC WITH DIFFERENTIAL/PLATELET  COMPREHENSIVE METABOLIC PANEL  BRAIN NATRIURETIC PEPTIDE  TROPONIN I (HIGH SENSITIVITY)    EKG None  Radiology DG Chest 2 View  Result Date: 12/10/2022 CLINICAL DATA:  Cough and shortness of breath. EXAM: CHEST - 2 VIEW COMPARISON:  December 05, 2022 FINDINGS: The heart size and mediastinal contours are within normal limits. Mild areas of atelectasis and/or early infiltrate are seen within the bilateral lung bases, left greater than right. This represents a new finding when compared to the prior study. There is no evidence of a pleural effusion or pneumothorax. The visualized skeletal structures are unremarkable. IMPRESSION: Mild bibasilar atelectasis and/or early infiltrate, left greater than right. Electronically Signed   By: Joyce Gross.D.  On: 12/10/2022 22:56    Procedures .Critical Care  Performed by: Regan Lemming, MD Authorized by: Regan Lemming, MD   Critical care provider statement:    Critical care time (minutes):  30   Critical care was necessary to treat or prevent imminent or life-threatening deterioration of the following conditions:  Sepsis   Critical care was time spent personally by me on the following activities:  Development of treatment plan with patient or surrogate, discussions with consultants, evaluation of patient's response to treatment, examination of patient, ordering and review of laboratory studies, ordering and  review of radiographic studies, ordering and performing treatments and interventions, pulse oximetry, re-evaluation of patient's condition and review of old charts   Care discussed with: admitting provider       Medications Ordered in ED Medications - No data to display  ED Course/ Medical Decision Making/ A&P Clinical Course as of 12/10/22 2348  Mon Dec 10, 2022  2308 WBC(!): 12.3 [JL]  2308 Pulse Rate(!): 132 [JL]  2308 Resp(!): 26 [JL]    Clinical Course User Index [JL] Regan Lemming, MD                             Medical Decision Making Amount and/or Complexity of Data Reviewed Labs: ordered. Decision-making details documented in ED Course. Radiology: ordered.  Risk Prescription drug management.    62 year old female with medical history significant for DM2, hypertension, recent emergency department visit on 12/05/2022, diagnosed with viral URI at that time and unspecified tachycardia who presents to the emergency department with shortness of breath, palpitations and diaphoresis.  She states that she has had worsening productive cough and labored breathing, difficulty laying flat.  She denies any lower extremity swelling.  She denies any chest pain.  She denies any fevers.  She endorses worsening dyspnea, orthopnea, continued cough.  On arrival, the patient was afebrile, tachycardic heart rate 132, tachypneic RR 26, saturating 99% on room air, normotensive BP 131/70.  Sinus tachycardia noted on cardiac telemetry.  Initial laboratory evaluation significant for a leukocytosis of 12.3 and a chest x-ray revealed an infiltrate concerning for pneumonia.  Due to concern for developing sepsis from a pneumonia, likely post URI, code sepsis was initiated and the patient was covered with broad-spectrum antibiotics and administered IV fluids for her tachycardia.  Additionally on the differential as a PE, CTA PE study was ordered.  ***  {Document critical care time when  appropriate:1} {Document review of labs and clinical decision tools ie heart score, Chads2Vasc2 etc:1}  {Document your independent review of radiology images, and any outside records:1} {Document your discussion with family members, caretakers, and with consultants:1} {Document social determinants of health affecting pt's care:1} {Document your decision making why or why not admission, treatments were needed:1} Final Clinical Impression(s) / ED Diagnoses Final diagnoses:  None    Rx / DC Orders ED Discharge Orders     None

## 2022-12-10 NOTE — ED Triage Notes (Signed)
Patient reports waking up from sleep at 2100 and being SOB and coughing. Patient has labored breathing and weak cough on arrival. O2 is 99% on RA.  Patient recently seen for URI that started last week.

## 2022-12-11 ENCOUNTER — Emergency Department (HOSPITAL_COMMUNITY): Payer: BC Managed Care – PPO

## 2022-12-11 DIAGNOSIS — D72829 Elevated white blood cell count, unspecified: Secondary | ICD-10-CM

## 2022-12-11 DIAGNOSIS — R000 Tachycardia, unspecified: Secondary | ICD-10-CM | POA: Diagnosis not present

## 2022-12-11 DIAGNOSIS — D649 Anemia, unspecified: Secondary | ICD-10-CM

## 2022-12-11 DIAGNOSIS — R5381 Other malaise: Secondary | ICD-10-CM | POA: Diagnosis present

## 2022-12-11 DIAGNOSIS — I10 Essential (primary) hypertension: Secondary | ICD-10-CM | POA: Diagnosis present

## 2022-12-11 DIAGNOSIS — D75839 Thrombocytosis, unspecified: Secondary | ICD-10-CM

## 2022-12-11 DIAGNOSIS — R0600 Dyspnea, unspecified: Secondary | ICD-10-CM | POA: Diagnosis present

## 2022-12-11 LAB — URINALYSIS, ROUTINE W REFLEX MICROSCOPIC
Bacteria, UA: NONE SEEN
Bilirubin Urine: NEGATIVE
Glucose, UA: NEGATIVE mg/dL
Ketones, ur: 5 mg/dL — AB
Leukocytes,Ua: NEGATIVE
Nitrite: NEGATIVE
Protein, ur: NEGATIVE mg/dL
Specific Gravity, Urine: <=1.005 (ref 1.005–1.030)
pH: 5 (ref 5.0–8.0)

## 2022-12-11 LAB — RESPIRATORY PANEL BY PCR

## 2022-12-11 LAB — IRON AND TIBC
Iron: 21 ug/dL — ABNORMAL LOW (ref 28–170)
Saturation Ratios: 10 % — ABNORMAL LOW (ref 10.4–31.8)
TIBC: 211 ug/dL — ABNORMAL LOW (ref 250–450)
UIBC: 190 ug/dL

## 2022-12-11 LAB — PROCALCITONIN: Procalcitonin: <0.1 ng/mL

## 2022-12-11 LAB — T4, FREE: Free T4: 1.13 ng/dL — ABNORMAL HIGH (ref 0.61–1.12)

## 2022-12-11 LAB — LACTIC ACID, PLASMA: Lactic Acid, Venous: 1.5 mmol/L (ref 0.5–1.9)

## 2022-12-11 LAB — C-REACTIVE PROTEIN: CRP: 6.2 mg/dL — ABNORMAL HIGH (ref <1.0)

## 2022-12-11 LAB — TSH: TSH: 0.425 u[IU]/mL (ref 0.350–4.500)

## 2022-12-11 LAB — GROUP A STREP BY PCR: Group A Strep by PCR: NOT DETECTED

## 2022-12-11 LAB — RETICULOCYTES
Immature Retic Fract: 30 % — ABNORMAL HIGH (ref 2.3–15.9)
RBC.: 3.77 MIL/uL — ABNORMAL LOW (ref 3.87–5.11)
Retic Count, Absolute: 53.9 10*3/uL (ref 19.0–186.0)
Retic Ct Pct: 1.4 % (ref 0.4–3.1)

## 2022-12-11 LAB — FOLATE: Folate: 11.2 ng/mL (ref >5.9)

## 2022-12-11 LAB — TROPONIN I (HIGH SENSITIVITY): Troponin I (High Sensitivity): 27 ng/L — ABNORMAL HIGH (ref <18)

## 2022-12-11 LAB — MAGNESIUM: Magnesium: 1.9 mg/dL (ref 1.7–2.4)

## 2022-12-11 LAB — HIV ANTIBODY (ROUTINE TESTING W REFLEX): HIV Screen 4th Generation wRfx: NONREACTIVE

## 2022-12-11 LAB — FERRITIN: Ferritin: 38 ng/mL (ref 11–307)

## 2022-12-11 LAB — GLUCOSE, CAPILLARY: Glucose-Capillary: 176 mg/dL — ABNORMAL HIGH (ref 70–99)

## 2022-12-11 LAB — VITAMIN B12: Vitamin B-12: 231 pg/mL (ref 180–914)

## 2022-12-11 MED ORDER — BENZONATATE 100 MG PO CAPS: 100.0000 mg | ORAL_CAPSULE | Freq: Three times a day (TID) | ORAL | 0 refills | Status: DC

## 2022-12-11 MED ORDER — METOPROLOL TARTRATE 5 MG/5ML IV SOLN: 5.0000 mg | Freq: Four times a day (QID) | INTRAVENOUS | Status: DC | PRN

## 2022-12-11 MED ORDER — METHYLPREDNISOLONE 4 MG PO TBPK: ORAL_TABLET | ORAL | 0 refills | Status: DC

## 2022-12-11 MED ORDER — LACTATED RINGERS IV BOLUS (SEPSIS)
500.0000 mL | Freq: Once | INTRAVENOUS | Status: DC
  Administered 2022-12-11: 500 mL via INTRAVENOUS

## 2022-12-11 MED ORDER — DOCUSATE SODIUM 100 MG PO CAPS
100.0000 mg | ORAL_CAPSULE | Freq: Two times a day (BID) | ORAL | Status: DC
  Administered 2022-12-11: 100 mg via ORAL
  Filled 2022-12-11 (×2): qty 1

## 2022-12-11 MED ORDER — AZITHROMYCIN 250 MG PO TABS: 250.0000 mg | ORAL_TABLET | Freq: Every day | ORAL | 0 refills | Status: DC

## 2022-12-11 MED ORDER — TRAZODONE HCL 50 MG PO TABS
25.0000 mg | ORAL_TABLET | Freq: Every evening | ORAL | Status: DC | PRN
  Administered 2022-12-16: 25 mg via ORAL
  Filled 2022-12-11: qty 1

## 2022-12-11 MED ORDER — POTASSIUM CHLORIDE CRYS ER 20 MEQ PO TBCR
40.0000 meq | EXTENDED_RELEASE_TABLET | Freq: Once | ORAL | Status: AC
  Administered 2022-12-11: 40 meq via ORAL
  Filled 2022-12-11: qty 2

## 2022-12-11 MED ORDER — SODIUM CHLORIDE (PF) 0.9 % IJ SOLN
INTRAMUSCULAR | Status: AC
  Filled 2022-12-11: qty 50

## 2022-12-11 MED ORDER — ALBUTEROL SULFATE HFA 108 (90 BASE) MCG/ACT IN AERS: 1.0000 | INHALATION_SPRAY | Freq: Four times a day (QID) | RESPIRATORY_TRACT | 0 refills | Status: DC | PRN

## 2022-12-11 MED ORDER — POLYETHYLENE GLYCOL 3350 17 G PO PACK: 17.0000 g | PACK | Freq: Every day | ORAL | Status: DC | PRN

## 2022-12-11 MED ORDER — IOHEXOL 350 MG/ML SOLN
100.0000 mL | Freq: Once | INTRAVENOUS | Status: AC | PRN
  Administered 2022-12-11: 75 mL via INTRAVENOUS

## 2022-12-11 MED ORDER — ONDANSETRON HCL 4 MG PO TABS: 4.0000 mg | ORAL_TABLET | Freq: Four times a day (QID) | ORAL | Status: DC | PRN

## 2022-12-11 MED ORDER — ENOXAPARIN SODIUM 40 MG/0.4ML IJ SOSY
40.0000 mg | PREFILLED_SYRINGE | INTRAMUSCULAR | Status: DC
  Administered 2022-12-11 – 2022-12-12 (×2): 40 mg via SUBCUTANEOUS
  Filled 2022-12-11 (×2): qty 0.4

## 2022-12-11 MED ORDER — IPRATROPIUM-ALBUTEROL 0.5-2.5 (3) MG/3ML IN SOLN
3.0000 mL | Freq: Once | RESPIRATORY_TRACT | Status: AC
  Administered 2022-12-11: 3 mL via RESPIRATORY_TRACT
  Filled 2022-12-11: qty 3

## 2022-12-11 MED ORDER — ONDANSETRON HCL 4 MG/2ML IJ SOLN: 4.0000 mg | Freq: Four times a day (QID) | INTRAMUSCULAR | Status: DC | PRN

## 2022-12-11 NOTE — ED Notes (Signed)
Contacted lab and added on ordered labs for pt. Lab confirmed they had what they needed to run the new ordered labs.

## 2022-12-11 NOTE — ED Notes (Signed)
Patient ambulated down hall with tele and pulse ox. HR 122-125 and O2 97%.

## 2022-12-11 NOTE — ED Notes (Signed)
ED TO INPATIENT HANDOFF REPORT    S Name/Age/Gender Tracey Blackburn 62 y.o. female Room/Bed: WA03/WA03  Code Status   Code Status: Full Code  Home/SNF/Other Home Patient oriented to: self, place, time, and situation Is this baseline? Yes   Triage Complete: Triage complete  Chief Complaint Tachycardia [R00.0]  Triage Note Patient reports waking up from sleep at 2100 and being SOB and coughing. Patient has labored breathing and weak cough on arrival. O2 is 99% on RA.  Patient recently seen for URI that started last week.    Allergies No Known Allergies  Level of Care/Admitting Diagnosis ED Disposition     ED Disposition  Admit   Condition  --   Comment  Hospital Area: [facility] [[id number]]  Level of Care: Telemetry [5]  Admit to tele based on following criteria: Complex arrhythmia (Bradycardia/Tachycardia)  May place patient in observation at [facility] or [facility] if equivalent level of care is available:: Yes  Covid Evaluation: Confirmed COVID Negative  Diagnosis: Tachycardia [242249]  Admitting Physician: Lucillie Garfinkel GP:[id number]  Attending Physician: [facility], MIR Kari.Conine GP:[id number]          B Medical/Surgery History Past Medical History:  Diagnosis Date   Diabetes mellitus without complication ([place])    Hypertension    Past Surgical History:  Procedure Laterality Date   CESAREAN SECTION       A IV Location/Drains/Wounds Patient Lines/Drains/Airways Status     Active Line/Drains/Airways     Name Placement date Placement time Site Days   Peripheral IV 12/10/22 20 G 1" Left Antecubital 12/10/22  2215  Antecubital  1   Peripheral IV 12/10/22 20 G 1" Right Antecubital 12/10/22  2251  Antecubital  1            Intake/Output Last 24 hours  Intake/Output Summary (Last 24 hours) at 12/11/2022 1440 Last data filed at 12/11/2022 0646 Gross per 24 hour  Intake 2536.68 ml  Output --  Net 2536.68 ml    Labs/Imaging Results  for orders placed or performed during the hospital encounter of 12/10/22 (from the past 48 hour(s))  CBC with Differential     Status: Abnormal   Collection Time: 12/10/22 10:34 PM  Result Value Ref Range   WBC 12.3 (H) 4.0 - 10.5 K/uL   RBC 4.16 3.87 - 5.11 MIL/uL   Hemoglobin 9.5 (L) 12.0 - 15.0 g/dL   HCT 30.8 (L) 36.0 - 46.0 %   MCV 74.0 (L) 80.0 - 100.0 fL   MCH 22.8 (L) 26.0 - 34.0 pg   MCHC 30.8 30.0 - 36.0 g/dL   RDW 17.7 (H) 11.5 - 15.5 %   Platelets 464 (H) 150 - 400 K/uL   nRBC 0.0 0.0 - 0.2 %   Neutrophils Relative % 71 %   Neutro Abs 8.9 (H) 1.7 - 7.7 K/uL   Lymphocytes Relative 16 %   Lymphs Abs 1.9 0.7 - 4.0 K/uL   Monocytes Relative 9 %   Monocytes Absolute 1.1 (H) 0.1 - 1.0 K/uL   Eosinophils Relative 3 %   Eosinophils Absolute 0.3 0.0 - 0.5 K/uL   Basophils Relative 0 %   Basophils Absolute 0.0 0.0 - 0.1 K/uL   Immature Granulocytes 1 %   Abs Immature Granulocytes 0.06 0.00 - 0.07 K/uL    Comment: Performed at [facility], [place] [address], [place],  [address]  Comprehensive metabolic panel     Status: Abnormal   Collection Time: 12/10/22 10:34  PM  Result Value Ref Range   Sodium 131 (L) 135 - 145 mmol/L   Potassium 3.2 (L) 3.5 - 5.1 mmol/L   Chloride 101 98 - 111 mmol/L   CO2 22 22 - 32 mmol/L   Glucose, Bld 136 (H) 70 - 99 mg/dL    Comment: Glucose reference range applies only to samples taken after fasting for at least 8 hours.   BUN 7 (L) 8 - 23 mg/dL   Creatinine, Ser 0.71 0.44 - 1.00 mg/dL   Calcium 8.0 (L) 8.9 - 10.3 mg/dL   Total Protein 8.0 6.5 - 8.1 g/dL   Albumin 2.8 (L) 3.5 - 5.0 g/dL   AST 17 15 - 41 U/L   ALT 10 0 - 44 U/L   Alkaline Phosphatase 56 38 - 126 U/L   Total Bilirubin 0.7 0.3 - 1.2 mg/dL   GFR, Estimated >60 >60 mL/min    Comment: (NOTE) Calculated using the CKD-EPI Creatinine Equation (2021)    Anion gap 8 5 - 15    Comment: Performed at [facility], [place] [address]  [place], [place] [address]  Brain natriuretic peptide     Status: None   Collection Time: 12/10/22 10:34 PM  Result Value Ref Range   B Natriuretic Peptide 23.4 0.0 - 100.0 pg/mL    Comment: Performed at [facility], [place] [address] [place], [place] [address]  Troponin I (High Sensitivity)     Status: Abnormal   Collection Time: 12/10/22 10:36 PM  Result Value Ref Range   Troponin I (High Sensitivity) 21 (H) <18 ng/L    Comment: (NOTE) Elevated high sensitivity troponin I (hsTnI) values and significant  changes across serial measurements may suggest ACS but many other  chronic and acute conditions are known to elevate hsTnI results.  Refer to the "Links" section for chest pain algorithms and additional  guidance. Performed at [facility], [place] [address], [place], [place] [address]   Resp panel by RT-PCR (RSV, Flu A&B, Covid) Anterior Nasal Swab     Status: None   Collection Time: 12/10/22 10:52 PM   Specimen: Anterior Nasal Swab  Result Value Ref Range   SARS Coronavirus 2 by RT PCR NEGATIVE NEGATIVE    Comment: (NOTE) SARS-CoV-2 target nucleic acids are NOT DETECTED.  The SARS-CoV-2 RNA is generally detectable in upper respiratory specimens during the acute phase of infection. The lowest concentration of SARS-CoV-2 viral copies this assay can detect is 138 copies/mL. A negative result does not preclude SARS-Cov-2 infection and should not be used as the sole basis for treatment or other patient management decisions. A negative result may occur with  improper specimen collection/handling, submission of specimen other than nasopharyngeal swab, presence of viral mutation(s) within the areas targeted by this assay, and inadequate number of viral copies(<138 copies/mL). A negative result must be combined with clinical observations, patient history, and epidemiological information. The expected result is Negative.  Fact Sheet for Patients:   EntrepreneurPulse.com.au  Fact Sheet for Healthcare Providers:  IncredibleEmployment.be  This test is no t yet approved or cleared by the [place] FDA and  has been authorized for detection and/or diagnosis of SARS-CoV-2 by FDA under an Emergency Use Authorization (EUA). This EUA will remain  in effect (meaning this test can be used) for the duration of the COVID-19 declaration under Section 564(b)(1) of the Act, 21 U.S.C.section 360bbb-3(b)(1), unless the authorization is terminated  or revoked sooner.  Influenza A by PCR NEGATIVE NEGATIVE   Influenza B by PCR NEGATIVE NEGATIVE    Comment: (NOTE) The Xpert Xpress SARS-CoV-2/FLU/RSV plus assay is intended as an aid in the diagnosis of influenza from Nasopharyngeal swab specimens and should not be used as a sole basis for treatment. Nasal washings and aspirates are unacceptable for Xpert Xpress SARS-CoV-2/FLU/RSV testing.  Fact Sheet for Patients: EntrepreneurPulse.com.au  Fact Sheet for Healthcare Providers: IncredibleEmployment.be  This test is not yet approved or cleared by the [place] FDA and has been authorized for detection and/or diagnosis of SARS-CoV-2 by FDA under an Emergency Use Authorization (EUA). This EUA will remain in effect (meaning this test can be used) for the duration of the COVID-19 declaration under Section 564(b)(1) of the Act, 21 U.S.C. section 360bbb-3(b)(1), unless the authorization is terminated or revoked.     Resp Syncytial Virus by PCR NEGATIVE NEGATIVE    Comment: (NOTE) Fact Sheet for Patients: EntrepreneurPulse.com.au  Fact Sheet for Healthcare Providers: IncredibleEmployment.be  This test is not yet approved or cleared by the [place] FDA and has been authorized for detection and/or diagnosis of SARS-CoV-2 by FDA under an Emergency Use Authorization (EUA).  This EUA will remain in effect (meaning this test can be used) for the duration of the COVID-19 declaration under Section 564(b)(1) of the Act, 21 U.S.C. section 360bbb-3(b)(1), unless the authorization is terminated or revoked.  Performed at [facility], [place] [address] [place], [place] [address]   Respiratory (~20 pathogens) panel by PCR     Status: None   Collection Time: 12/10/22 10:52 PM   Specimen: Nasopharyngeal Swab; Respiratory  Result Value Ref Range   Adenovirus NOT DETECTED NOT DETECTED   Coronavirus 229E NOT DETECTED NOT DETECTED    Comment: (NOTE) The Coronavirus on the Respiratory Panel, DOES NOT test for the novel  Coronavirus (2019 nCoV)    Coronavirus HKU1 NOT DETECTED NOT DETECTED   Coronavirus NL63 NOT DETECTED NOT DETECTED   Coronavirus OC43 NOT DETECTED NOT DETECTED   Metapneumovirus NOT DETECTED NOT DETECTED   Rhinovirus / Enterovirus NOT DETECTED NOT DETECTED   Influenza A NOT DETECTED NOT DETECTED   Influenza B NOT DETECTED NOT DETECTED   Parainfluenza Virus 1 NOT DETECTED NOT DETECTED   Parainfluenza Virus 2 NOT DETECTED NOT DETECTED   Parainfluenza Virus 3 NOT DETECTED NOT DETECTED   Parainfluenza Virus 4 NOT DETECTED NOT DETECTED   Respiratory Syncytial Virus NOT DETECTED NOT DETECTED   Bordetella pertussis NOT DETECTED NOT DETECTED   Bordetella Parapertussis NOT DETECTED NOT DETECTED   Chlamydophila pneumoniae NOT DETECTED NOT DETECTED   Mycoplasma pneumoniae NOT DETECTED NOT DETECTED    Comment: Performed at [facility], [place] [address], [place], [place] [address]  Lactic acid, plasma     Status: None   Collection Time: 12/10/22 11:32 PM  Result Value Ref Range   Lactic Acid, Venous 1.5 0.5 - 1.9 mmol/L    Comment: Performed at [facility], [place] [address], [place], [place] [address]  Troponin I (High Sensitivity)     Status: Abnormal   Collection Time: 12/11/22 12:55 AM  Result Value Ref Range    Troponin I (High Sensitivity) 27 (H) <18 ng/L    Comment: (NOTE) Elevated high sensitivity troponin I (hsTnI) values and significant  changes across serial measurements may suggest ACS but many other  chronic and acute conditions are known to elevate hsTnI results.  Refer to the "Links" section for chest pain  algorithms and additional  guidance. Performed at [facility], [place] [address] [place], [place] [address]   Procalcitonin     Status: None   Collection Time: 12/11/22  1:12 AM  Result Value Ref Range   Procalcitonin <0.10 ng/mL    Comment:        Interpretation: PCT (Procalcitonin) <= 0.5 ng/mL: Systemic infection (sepsis) is not likely. Local bacterial infection is possible. (NOTE)       Sepsis PCT Algorithm           Lower Respiratory Tract                                      Infection PCT Algorithm    ----------------------------     ----------------------------         PCT < 0.25 ng/mL                PCT < 0.10 ng/mL          Strongly encourage             Strongly discourage   discontinuation of antibiotics    initiation of antibiotics    ----------------------------     -----------------------------       PCT 0.25 - 0.50 ng/mL            PCT 0.10 - 0.25 ng/mL               OR       >80% decrease in PCT            Discourage initiation of                                            antibiotics      Encourage discontinuation           of antibiotics    ----------------------------     -----------------------------         PCT >= 0.50 ng/mL              PCT 0.26 - 0.50 ng/mL               AND        <80% decrease in PCT             Encourage initiation of                                             antibiotics       Encourage continuation           of antibiotics    ----------------------------     -----------------------------        PCT >= 0.50 ng/mL                  PCT > 0.50 ng/mL               AND         increase in PCT                   Strongly encourage  initiation of antibiotics    Strongly encourage escalation           of antibiotics                                     -----------------------------                                           PCT <= 0.25 ng/mL                                                 OR                                        > 80% decrease in PCT                                      Discontinue / Do not initiate                                             antibiotics  Performed at [place] [address], [place], [place] [address]   Magnesium     Status: None   Collection Time: 12/11/22  1:12 AM  Result Value Ref Range   Magnesium 1.9 1.7 - 2.4 mg/dL    Comment: Performed at [facility], [place] [address], [place], [place] [address]  TSH     Status: None   Collection Time: 12/11/22  2:40 AM  Result Value Ref Range   TSH 0.425 0.350 - 4.500 uIU/mL    Comment: Performed by a 3rd Generation assay with a functional sensitivity of <=0.01 uIU/mL. Performed at [facility], [place] [address], [place], [place] [address]   T4, free     Status: Abnormal   Collection Time: 12/11/22  2:40 AM  Result Value Ref Range   Free T4 1.13 (H) 0.61 - 1.12 ng/dL    Comment: (NOTE) Biotin ingestion may interfere with free T4 tests. If the results are inconsistent with the TSH level, previous test results, or the clinical presentation, then consider biotin interference. If needed, order repeat testing after stopping biotin. Performed at [facility], [place] [address], [place], [place] [address]   C-reactive protein     Status: Abnormal   Collection Time: 12/11/22  2:49 AM  Result Value Ref Range   CRP 6.2 (H) <1.0 mg/dL    Comment: Performed at [place] [address], [place],  [address]  Urinalysis, Routine w reflex microscopic -Urine, Clean Catch     Status: Abnormal   Collection Time:  12/11/22  3:22 AM  Result Value Ref Range   Color, Urine YELLOW YELLOW   APPearance CLEAR CLEAR   Specific Gravity, Urine <=1.005 1.005 - 1.030   pH 5.0 5.0 - 8.0   Glucose, UA NEGATIVE NEGATIVE  mg/dL   Hgb urine dipstick SMALL (A) NEGATIVE   Bilirubin Urine NEGATIVE NEGATIVE   Ketones, ur 5 (A) NEGATIVE mg/dL   Protein, ur NEGATIVE NEGATIVE mg/dL   Nitrite NEGATIVE NEGATIVE   Leukocytes,Ua NEGATIVE NEGATIVE   RBC / HPF 0-5 0 - 5 RBC/hpf   WBC, UA 0-5 0 - 5 WBC/hpf   Bacteria, UA NONE SEEN NONE SEEN   Squamous Epithelial / HPF 0-5 0 - 5 /HPF   Mucus PRESENT    Hyaline Casts, UA PRESENT     Comment: Performed at [facility], [place] [address] [place], [place] [address]  Group A Strep by PCR     Status: None   Collection Time: 12/11/22  8:27 AM   Specimen: Throat; Sterile Swab  Result Value Ref Range   Group A Strep by PCR NOT DETECTED NOT DETECTED    Comment: Performed at [facility], [place] [address] [place], [place] [address]  Vitamin B12     Status: None   Collection Time: 12/11/22 12:00 PM  Result Value Ref Range   Vitamin B-12 231 180 - 914 pg/mL    Comment: (NOTE) This assay is not validated for testing neonatal or myeloproliferative syndrome specimens for Vitamin B12 levels. Performed at [facility], [place] [address] [place], [place] [address]   Folate     Status: None   Collection Time: 12/11/22 12:00 PM  Result Value Ref Range   Folate 11.2 >5.9 ng/mL    Comment: Performed at [facility], [place] [address], [place], [place] [address]  Iron and TIBC     Status: Abnormal   Collection Time: 12/11/22 12:00 PM  Result Value Ref Range   Iron 21 (L) 28 - 170 ug/dL   TIBC 211 (L) 250 - 450 ug/dL   Saturation Ratios 10 (L) 10.4 - 31.8 %   UIBC 190 ug/dL    Comment: Performed at [facility], [place] [address], [place], [place] [address]  Ferritin     Status: None   Collection  Time: 12/11/22 12:00 PM  Result Value Ref Range   Ferritin 38 11 - 307 ng/mL    Comment: Performed at [facility], [place] [address] [place], [place] [address]  Reticulocytes     Status: Abnormal   Collection Time: 12/11/22 12:00 PM  Result Value Ref Range   Retic Ct Pct 1.4 0.4 - 3.1 %   RBC. 3.77 (L) 3.87 - 5.11 MIL/uL   Retic Count, Absolute 53.9 19.0 - 186.0 K/uL   Immature Retic Fract 30.0 (H) 2.3 - 15.9 %    Comment: Performed at [facility], [place] [address], [place], [place] [address]   CT Angio Chest PE W and/or Wo Contrast  Result Date: 12/11/2022 CLINICAL DATA:  Pulmonary embolism (PE) suspected, high prob. Shortness of breath, cough EXAM: CT ANGIOGRAPHY CHEST WITH CONTRAST TECHNIQUE: Multidetector CT imaging of the chest was performed using the standard protocol during bolus administration of intravenous contrast. Multiplanar CT image reconstructions and MIPs were obtained to evaluate the vascular anatomy. RADIATION DOSE REDUCTION: This exam was performed according to the departmental dose-optimization program which includes automated exposure control, adjustment of the mA and/or kV according to patient size and/or use of iterative reconstruction technique. CONTRAST:  39mL OMNIPAQUE IOHEXOL 350 MG/ML SOLN COMPARISON:  None Available. FINDINGS: Cardiovascular: No filling defects in the pulmonary arteries to suggest pulmonary emboli. Heart borderline in size. Aorta normal caliber.  Mediastinum/Nodes: No mediastinal, hilar, or axillary adenopathy. Trachea and esophagus are unremarkable. Thyroid unremarkable. Lungs/Pleura: No confluent opacities or effusions. Upper Abdomen: Diffuse low-density throughout the liver compatible with fatty infiltration. Central low-density in the liver measures 2.6 cm, likely cyst. Musculoskeletal: Chest wall soft tissues are unremarkable. No acute bony abnormality. Review of the MIP images confirms the above findings.  IMPRESSION: No evidence of pulmonary embolus. No acute cardiopulmonary disease. Hepatic steatosis Electronically Signed   By: Rolm Baptise M.D.   On: 12/11/2022 00:36   DG Chest 2 View  Result Date: 12/10/2022 CLINICAL DATA:  Cough and shortness of breath. EXAM: CHEST - 2 VIEW COMPARISON:  December 05, 2022 FINDINGS: The heart size and mediastinal contours are within normal limits. Mild areas of atelectasis and/or early infiltrate are seen within the bilateral lung bases, left greater than right. This represents a new finding when compared to the prior study. There is no evidence of a pleural effusion or pneumothorax. The visualized skeletal structures are unremarkable. IMPRESSION: Mild bibasilar atelectasis and/or early infiltrate, left greater than right. Electronically Signed   By: Virgina Norfolk M.D.   On: 12/10/2022 22:56    Pending Labs Unresulted Labs (From admission, onward)     Start     Ordered   12/18/22 0500  Creatinine, serum  (enoxaparin (LOVENOX)    CrCl >/= 30 ml/min)  Weekly,   R     Comments: while on enoxaparin therapy    12/11/22 0906   12/12/22 0500  Comprehensive metabolic panel  Tomorrow morning,   R        12/11/22 0906   12/12/22 0500  CBC  Tomorrow morning,   R        12/11/22 0906   12/11/22 0859  HIV Antibody (routine testing w rflx)  (HIV Antibody (Routine testing w reflex) panel)  Once,   R        12/11/22 0906   12/10/22 2309  Blood culture (routine x 2)  BLOOD CULTURE X 2,   R (with STAT occurrences)      12/10/22 2308            Vitals/Pain Today's Vitals   12/11/22 1115 12/11/22 1255 12/11/22 1300 12/11/22 1430  BP: (!) 142/72 (!) 151/74 (!) 144/68 (!) 142/69  Pulse: (!) 108 (!) 111 (!) 108 (!) 107  Resp:  (!) 22 16 13   Temp:    98 F (36.7 C)  TempSrc:    Oral  SpO2: 98% 98% 98% 99%  Weight:      Height:      PainSc:        Isolation Precautions No active isolations  Medications Medications  enoxaparin (LOVENOX) injection 40 mg (40  mg Subcutaneous Given 12/11/22 0927)  traZODone (DESYREL) tablet 25 mg (has no administration in time range)  docusate sodium (COLACE) capsule 100 mg (100 mg Oral Given 12/11/22 0928)  polyethylene glycol (MIRALAX / GLYCOLAX) packet 17 g (has no administration in time range)  ondansetron (ZOFRAN) tablet 4 mg (has no administration in time range)    Or  ondansetron (ZOFRAN) injection 4 mg (has no administration in time range)  metoprolol tartrate (LOPRESSOR) injection 5 mg (has no administration in time range)  lactated ringers bolus 1,000 mL (0 mLs Intravenous Stopped 12/11/22 0230)  albuterol (VENTOLIN HFA) 108 (90 Base) MCG/ACT inhaler 2 puff (2 puffs Inhalation Given 12/10/22 2358)  iohexol (OMNIPAQUE) 350 MG/ML injection 100 mL (75 mLs Intravenous Contrast Given 12/11/22 0010)  ipratropium-albuterol (DUONEB)  0.5-2.5 (3) MG/3ML nebulizer solution 3 mL (3 mLs Nebulization Given 12/11/22 0438)  potassium chloride SA (KLOR-CON M) CR tablet 40 mEq (40 mEq Oral Given 12/11/22 0726)    Mobility walks     Focused Assessments Cardiac Assessment Handoff:  Cardiac Rhythm: Normal sinus rhythm, Sinus tachycardia No results found for: "CKTOTAL", "CKMB", "CKMBINDEX", "TROPONINI" No results found for: "DDIMER" Does the Patient currently have chest pain? No    R Recommendations: See Admitting Provider Note  Report given to:

## 2022-12-11 NOTE — Discharge Instructions (Addendum)
Your CT imaging of no evidence of a blood clot, no evidence of clear bacterial pneumonia.  Your symptoms are consistent with likely acute bronchitis for which we will treat with cough suppression, steroid, short course of azithromycin. Return for any severe worsening of your symptoms.

## 2022-12-11 NOTE — H&P (Signed)
History and Physical  Tracey Blackburn [id number] DOB: [birth year] DOA: 12/10/2022  PCP: She has no primary care provider, has not seen a doctor in years  Chief Complaint: Congestion, Shortness of breath   HPI: Tracey Blackburn is a 62 y.o. female who does not see doctors and so has no medical diagnoses and does not take any medication, she is being admitted to the hospital for persistent tachycardia of unknown origin.  She initially started to feel unwell around March 10, with sinus congestion, runny nose and fatigue.  She denies any fevers, chills, nausea, vomiting.  No productive cough.  She had she went to urgent care on 3/13, had a bit of a sore throat at that time.  She was worked up with no significant findings.  She was discharged home, with instructions for symptomatic care.  In the subsequent days, she says that she has essentially remained unchanged, however yesterday she was feeling short of breath, but she did not get a full breath.  She went outside to try to catch her breath, and it only made her bleeding worse.  In fact it was so bad that she texted her daughter and told her that she cannot bleed, and that she needs to call an ambulance.  She was brought to the emergency department for evaluation.  Her daughter agrees that over the last week or so since she went to urgent care her mother has generally seems to be getting worse.  She is more shortness of breath with exertion, seems to be more tired.  He particularly feels that something out of the ordinary because he is not as energetic as she normally is.  ED Course: Here in the emergency department patient was noted to have a heart rate of 104 initially, but has peaked as high as 132.  Her blood pressure is elevated as high as 168/95, currently 142/73.  Lab work was done, she has a normal lactate of 1.5, hypokalemia potassium 3.2, sodium 131, creatinine is normal, she has a leukocytosis of 12,000, hemoglobin 9.5, platelets 464.  Review of  Systems: Please see HPI for pertinent positives and negatives. A complete 10 system review of systems are otherwise negative.  Past Medical History:  Diagnosis Date   Diabetes mellitus without complication ([place])    Hypertension    Past Surgical History:  Procedure Laterality Date   CESAREAN SECTION     Social History:  reports that she has never smoked. She has never used smokeless tobacco. She reports that she does not drink alcohol and does not use drugs.   No Known Allergies  Family History  Problem Relation Age of Onset   Diabetes Other    Hypertension Other      Prior to Admission medications   Medication Sig Start Date End Date Taking? Authorizing Provider  albuterol (VENTOLIN HFA) 108 (90 Base) MCG/ACT inhaler Inhale 1-2 puffs into the lungs every 6 (six) hours as needed for wheezing or shortness of breath. 12/11/22  Yes Regan Lemming, MD  azithromycin (ZITHROMAX) 250 MG tablet Take 1 tablet (250 mg total) by mouth daily. Take first 2 tablets together, then 1 every day until finished. 12/11/22  Yes Regan Lemming, MD  benzonatate (TESSALON) 100 MG capsule Take 1 capsule (100 mg total) by mouth every 8 (eight) hours. 12/11/22  Yes Regan Lemming, MD  methylPREDNISolone (MEDROL DOSEPAK) 4 MG TBPK tablet Take as prescribed on the box 12/11/22  Yes Regan Lemming, MD    Physical Exam: BP (!) 142/73  Pulse (!) 117   Temp 98.8 F (37.1 C) (Oral)   Resp 18   Ht 5\' 2"  (1.575 m)   Wt 88.2 kg   SpO2 100%   BMI 35.57 kg/m   General:  Alert, oriented, calm, in no acute distress, voice is a little altered, cracked per her daughter Eyes: EOMI, clear conjuctivae, white sclerea Neck: supple, no masses, trachea mildline  Cardiovascular: RRR, no murmurs or rubs, no peripheral edema  Respiratory: clear to auscultation bilaterally, no wheezes, no crackles  Abdomen: soft, nontender, nondistended, normal bowel tones heard  Skin: dry, no rashes  Musculoskeletal: no joint effusions,  normal range of motion  Psychiatric: appropriate affect, normal speech  Neurologic: extraocular muscles intact, clear speech, moving all extremities with intact sensorium          Labs on Admission:  Basic Metabolic Panel: Recent Labs  Lab 12/05/22 2158 12/10/22 2234 12/11/22 0112  NA 131* 131*  --   K 3.2* 3.2*  --   CL 100 101  --   CO2 22 22  --   GLUCOSE 112* 136*  --   BUN 9 7*  --   CREATININE 0.97 0.71  --   CALCIUM 8.3* 8.0*  --   MG  --   --  1.9   Liver Function Tests: Recent Labs  Lab 12/05/22 2158 12/10/22 2234  AST 23 17  ALT 15 10  ALKPHOS 49 56  BILITOT 0.7 0.7  PROT 8.0 8.0  ALBUMIN 3.0* 2.8*   No results for input(s): "LIPASE", "AMYLASE" in the last 168 hours. No results for input(s): "AMMONIA" in the last 168 hours. CBC: Recent Labs  Lab 12/05/22 2158 12/10/22 2234  WBC 10.8* 12.3*  NEUTROABS 7.2 8.9*  HGB 9.9* 9.5*  HCT 31.2* 30.8*  MCV 72.4* 74.0*  PLT 399 464*   Cardiac Enzymes: No results for input(s): "CKTOTAL", "CKMB", "CKMBINDEX", "TROPONINI" in the last 168 hours.  BNP (last 3 results) Recent Labs    12/10/22 2234  BNP 23.4    ProBNP (last 3 results) No results for input(s): "PROBNP" in the last 8760 hours.  CBG: No results for input(s): "GLUCAP" in the last 168 hours.  Radiological Exams on Admission: CT Angio Chest PE W and/or Wo Contrast  Result Date: 12/11/2022 CLINICAL DATA:  Pulmonary embolism (PE) suspected, high prob. Shortness of breath, cough EXAM: CT ANGIOGRAPHY CHEST WITH CONTRAST TECHNIQUE: Multidetector CT imaging of the chest was performed using the standard protocol during bolus administration of intravenous contrast. Multiplanar CT image reconstructions and MIPs were obtained to evaluate the vascular anatomy. RADIATION DOSE REDUCTION: This exam was performed according to the departmental dose-optimization program which includes automated exposure control, adjustment of the mA and/or kV according to patient  size and/or use of iterative reconstruction technique. CONTRAST:  42mL OMNIPAQUE IOHEXOL 350 MG/ML SOLN COMPARISON:  None Available. FINDINGS: Cardiovascular: No filling defects in the pulmonary arteries to suggest pulmonary emboli. Heart borderline in size. Aorta normal caliber. Mediastinum/Nodes: No mediastinal, hilar, or axillary adenopathy. Trachea and esophagus are unremarkable. Thyroid unremarkable. Lungs/Pleura: No confluent opacities or effusions. Upper Abdomen: Diffuse low-density throughout the liver compatible with fatty infiltration. Central low-density in the liver measures 2.6 cm, likely cyst. Musculoskeletal: Chest wall soft tissues are unremarkable. No acute bony abnormality. Review of the MIP images confirms the above findings. IMPRESSION: No evidence of pulmonary embolus. No acute cardiopulmonary disease. Hepatic steatosis Electronically Signed   By: Rolm Baptise M.D.   On: 12/11/2022 00:36  DG Chest 2 View  Result Date: 12/10/2022 CLINICAL DATA:  Cough and shortness of breath. EXAM: CHEST - 2 VIEW COMPARISON:  December 05, 2022 FINDINGS: The heart size and mediastinal contours are within normal limits. Mild areas of atelectasis and/or early infiltrate are seen within the bilateral lung bases, left greater than right. This represents a new finding when compared to the prior study. There is no evidence of a pleural effusion or pneumothorax. The visualized skeletal structures are unremarkable. IMPRESSION: Mild bibasilar atelectasis and/or early infiltrate, left greater than right. Electronically Signed   By: Virgina Norfolk M.D.   On: 12/10/2022 22:56    Assessment/Plan Principal Problem:   Tachycardia -unclear etiology of this, and in the setting of her other symptoms as well as leukocytosis points to a true process going on, though may not be infectious. -Observation admission to telemetry -Continue to monitor, obtain EKG if significant rhythm change -Obtain 2D echo   Essential  hypertension -will monitor blood pressure, though she will need to be started on an antihypertensive prior to discharge   Malaise and fatigue, with dyspnea -unclear etiology of this, she has had a good workup.  Since all of this started with upper respiratory/throat symptoms, will check a rapid strep.   Dyspnea   Thrombocytosis -probably reactionary   Normocytic anemia -will check iron studies PNL   Leukocytosis  DVT prophylaxis: Lovenox   Code Status:  Full  Consults called: None  Admission status: Obs   Time spent: 45 minutes  Akiva Josey Neva Seat MD [facility] Pager [phone]  If 7PM-7AM, please contact night-coverage [email or web address] Password [email or web address]  12/11/2022, 9:10 AM

## 2022-12-11 NOTE — ED Notes (Signed)
Sent a message to the nurse at 2:52, call at and asked for them to notify if they are ready, they state that the nurse is in a patients room. Says she will look soon.

## 2022-12-11 NOTE — Plan of Care (Signed)
  Problem: Education: Goal: Knowledge of General Education information will improve Description: Including pain rating scale, medication(s)/side effects and non-pharmacologic comfort measures Outcome: Progressing   Problem: Health Behavior/Discharge Planning: Goal: Ability to manage health-related needs will improve Outcome: Progressing   Problem: Clinical Measurements: Goal: Will remain free from infection Outcome: Progressing Goal: Diagnostic test results will improve Outcome: Progressing   Problem: Activity: Goal: Risk for activity intolerance will decrease Outcome: Progressing   Problem: Safety: Goal: Ability to remain free from injury will improve Outcome: Progressing   

## 2022-12-11 NOTE — ED Notes (Signed)
Patient up using commode

## 2022-12-11 NOTE — Progress Notes (Signed)
Elink monitoring for the code sepsis protocol.  

## 2022-12-12 ENCOUNTER — Inpatient Hospital Stay (HOSPITAL_COMMUNITY): Payer: BC Managed Care – PPO

## 2022-12-12 ENCOUNTER — Observation Stay (HOSPITAL_COMMUNITY): Payer: BC Managed Care – PPO

## 2022-12-12 DIAGNOSIS — Z833 Family history of diabetes mellitus: Secondary | ICD-10-CM | POA: Diagnosis not present

## 2022-12-12 DIAGNOSIS — R000 Tachycardia, unspecified: Secondary | ICD-10-CM | POA: Diagnosis present

## 2022-12-12 DIAGNOSIS — D75838 Other thrombocytosis: Secondary | ICD-10-CM | POA: Diagnosis present

## 2022-12-12 DIAGNOSIS — D62 Acute posthemorrhagic anemia: Secondary | ICD-10-CM | POA: Diagnosis not present

## 2022-12-12 DIAGNOSIS — K5100 Ulcerative (chronic) pancolitis without complications: Secondary | ICD-10-CM | POA: Diagnosis present

## 2022-12-12 DIAGNOSIS — I10 Essential (primary) hypertension: Secondary | ICD-10-CM | POA: Diagnosis present

## 2022-12-12 DIAGNOSIS — Z1152 Encounter for screening for COVID-19: Secondary | ICD-10-CM | POA: Diagnosis not present

## 2022-12-12 DIAGNOSIS — D509 Iron deficiency anemia, unspecified: Secondary | ICD-10-CM | POA: Diagnosis present

## 2022-12-12 DIAGNOSIS — I472 Ventricular tachycardia, unspecified: Secondary | ICD-10-CM | POA: Diagnosis not present

## 2022-12-12 DIAGNOSIS — E876 Hypokalemia: Secondary | ICD-10-CM | POA: Diagnosis present

## 2022-12-12 DIAGNOSIS — R7982 Elevated C-reactive protein (CRP): Secondary | ICD-10-CM | POA: Diagnosis present

## 2022-12-12 DIAGNOSIS — Z86010 Personal history of colonic polyps: Secondary | ICD-10-CM | POA: Diagnosis not present

## 2022-12-12 DIAGNOSIS — R0602 Shortness of breath: Secondary | ICD-10-CM | POA: Diagnosis not present

## 2022-12-12 DIAGNOSIS — E871 Hypo-osmolality and hyponatremia: Secondary | ICD-10-CM | POA: Diagnosis present

## 2022-12-12 DIAGNOSIS — Z8249 Family history of ischemic heart disease and other diseases of the circulatory system: Secondary | ICD-10-CM | POA: Diagnosis not present

## 2022-12-12 DIAGNOSIS — J209 Acute bronchitis, unspecified: Secondary | ICD-10-CM | POA: Diagnosis present

## 2022-12-12 DIAGNOSIS — R6510 Systemic inflammatory response syndrome (SIRS) of non-infectious origin without acute organ dysfunction: Secondary | ICD-10-CM | POA: Diagnosis present

## 2022-12-12 DIAGNOSIS — J069 Acute upper respiratory infection, unspecified: Secondary | ICD-10-CM | POA: Diagnosis present

## 2022-12-12 DIAGNOSIS — E860 Dehydration: Secondary | ICD-10-CM | POA: Diagnosis present

## 2022-12-12 DIAGNOSIS — Z79899 Other long term (current) drug therapy: Secondary | ICD-10-CM | POA: Diagnosis not present

## 2022-12-12 DIAGNOSIS — E538 Deficiency of other specified B group vitamins: Secondary | ICD-10-CM | POA: Diagnosis present

## 2022-12-12 DIAGNOSIS — Z6835 Body mass index (BMI) 35.0-35.9, adult: Secondary | ICD-10-CM | POA: Diagnosis not present

## 2022-12-12 DIAGNOSIS — R5381 Other malaise: Secondary | ICD-10-CM | POA: Diagnosis present

## 2022-12-12 DIAGNOSIS — E119 Type 2 diabetes mellitus without complications: Secondary | ICD-10-CM | POA: Diagnosis present

## 2022-12-12 LAB — COMPREHENSIVE METABOLIC PANEL
ALT: 11 U/L (ref 0–44)
AST: 14 U/L — ABNORMAL LOW (ref 15–41)
Albumin: 2.4 g/dL — ABNORMAL LOW (ref 3.5–5.0)
Alkaline Phosphatase: 43 U/L (ref 38–126)
Anion gap: 9 (ref 5–15)
BUN: 10 mg/dL (ref 8–23)
CO2: 25 mmol/L (ref 22–32)
Calcium: 8.3 mg/dL — ABNORMAL LOW (ref 8.9–10.3)
Chloride: 104 mmol/L (ref 98–111)
Creatinine, Ser: 0.68 mg/dL (ref 0.44–1.00)
GFR, Estimated: >60 mL/min (ref >60)
Glucose, Bld: 104 mg/dL — ABNORMAL HIGH (ref 70–99)
Potassium: 3.9 mmol/L (ref 3.5–5.1)
Sodium: 138 mmol/L (ref 135–145)
Total Bilirubin: 0.4 mg/dL (ref 0.3–1.2)
Total Protein: 6.7 g/dL (ref 6.5–8.1)

## 2022-12-12 LAB — CBC
HCT: 25.6 % — ABNORMAL LOW (ref 36.0–46.0)
HCT: 31.5 % — ABNORMAL LOW (ref 36.0–46.0)
Hemoglobin: 7.8 g/dL — ABNORMAL LOW (ref 12.0–15.0)
Hemoglobin: 9.3 g/dL — ABNORMAL LOW (ref 12.0–15.0)
MCH: 22.3 pg — ABNORMAL LOW (ref 26.0–34.0)
MCH: 22.5 pg — ABNORMAL LOW (ref 26.0–34.0)
MCHC: 30.5 g/dL (ref 30.0–36.0)
MCHC: 29.5 g/dL — ABNORMAL LOW (ref 30.0–36.0)
MCV: 73.4 fL — ABNORMAL LOW (ref 80.0–100.0)
MCV: 76.1 fL — ABNORMAL LOW (ref 80.0–100.0)
Platelets: 398 10*3/uL (ref 150–400)
Platelets: 332 10*3/uL (ref 150–400)
RBC: 3.49 MIL/uL — ABNORMAL LOW (ref 3.87–5.11)
RBC: 4.14 MIL/uL (ref 3.87–5.11)
RDW: 17.6 % — ABNORMAL HIGH (ref 11.5–15.5)
RDW: 18.4 % — ABNORMAL HIGH (ref 11.5–15.5)
WBC: 8.9 10*3/uL (ref 4.0–10.5)
WBC: 9.9 10*3/uL (ref 4.0–10.5)
nRBC: 0 % (ref 0.0–0.2)
nRBC: 0 % (ref 0.0–0.2)

## 2022-12-12 LAB — C DIFFICILE QUICK SCREEN W PCR REFLEX
C Diff antigen: NEGATIVE
C Diff interpretation: NOT DETECTED
C Diff toxin: NEGATIVE

## 2022-12-12 LAB — ECHOCARDIOGRAM COMPLETE
AR max vel: 2.04 cm2
AV Area VTI: 2.13 cm2
AV Area mean vel: 2.23 cm2
AV Mean grad: 4 mmHg
AV Peak grad: 8.1 mmHg
Ao pk vel: 1.42 m/s
Area-P 1/2: 3.23 cm2
Height: 62 in
S' Lateral: 3.8 cm
Single Plane A4C EF: 46.7 %
Weight: 3112.01 oz

## 2022-12-12 LAB — ABO/RH: ABO/RH(D): A POS

## 2022-12-12 LAB — HEMOGLOBIN AND HEMATOCRIT, BLOOD
HCT: 26.1 % — ABNORMAL LOW (ref 36.0–46.0)
Hemoglobin: 8 g/dL — ABNORMAL LOW (ref 12.0–15.0)

## 2022-12-12 MED ORDER — LACTATED RINGERS IV SOLN: INTRAVENOUS | Status: DC

## 2022-12-12 MED ORDER — CYANOCOBALAMIN 1000 MCG/ML IJ SOLN
1000.0000 ug | Freq: Every day | INTRAMUSCULAR | Status: DC
  Administered 2022-12-12 – 2022-12-17 (×6): 1000 ug via INTRAMUSCULAR
  Filled 2022-12-12 (×6): qty 1

## 2022-12-12 MED ORDER — LEVALBUTEROL HCL 0.63 MG/3ML IN NEBU: 0.6300 mg | INHALATION_SOLUTION | Freq: Four times a day (QID) | RESPIRATORY_TRACT | Status: DC | PRN

## 2022-12-12 MED ORDER — GUAIFENESIN-DM 100-10 MG/5ML PO SYRP
5.0000 mL | ORAL_SOLUTION | ORAL | Status: DC | PRN
  Administered 2022-12-12: 5 mL via ORAL
  Filled 2022-12-12: qty 10

## 2022-12-12 MED ORDER — PERFLUTREN LIPID MICROSPHERE
1.0000 mL | INTRAVENOUS | Status: AC | PRN
  Administered 2022-12-12: 2 mL via INTRAVENOUS

## 2022-12-12 MED ORDER — SODIUM CHLORIDE 0.9 % IV SOLN
2.0000 g | INTRAVENOUS | Status: DC
  Administered 2022-12-12 – 2022-12-14 (×3): 2 g via INTRAVENOUS
  Filled 2022-12-12 (×2): qty 20

## 2022-12-12 MED ORDER — GUAIFENESIN ER 600 MG PO TB12
1200.0000 mg | ORAL_TABLET | Freq: Two times a day (BID) | ORAL | Status: DC
  Administered 2022-12-12 – 2022-12-17 (×11): 1200 mg via ORAL
  Filled 2022-12-12 (×11): qty 2

## 2022-12-12 MED ORDER — IOHEXOL 300 MG/ML  SOLN
100.0000 mL | Freq: Once | INTRAMUSCULAR | Status: AC | PRN
  Administered 2022-12-12: 100 mL via INTRAVENOUS

## 2022-12-12 NOTE — Consult Note (Addendum)
UNASSIGNED PATIENT Reason for Consult: Bloody diarrhea. Referring Physician: [facility] hospitalist.  Tracey Blackburn is an 62 y.o. female.  HPI: Tracey Blackburn is a 62 year old black female with a history of diabetes and hypertension who presented to the emergency room a couple of days ago with a 10-day history of diarrhea with bright red blood in stool. She also had some shortness of breath which she was seen and evaluated at an Urgent care.  She had a CT of the chest that did not reveal any acute normalities and a PE was ruled; fatty infiltration of the liver was noted she she claims she has had 2-3 loose BMs per day with some fecal urgency for the last 10 days. She has been discharged from the [facility] practice by Dr. Wilford Corner due to noncompliance. She reportedly had a colonoscopy done about 3 years ago [as per the patient a small polyp was removed but exam was otherwise normal] . On reviewing her records her hemoglobin was 11.7 g/dL on 11/14/2021 and 7.8 g/dL on admission couple days ago. Her hemoglobin has been steadily declining over the last 2 to 3 years. Her labs revealed hyponatremia, hypokalemia along with elevated CRP with an Iron level of 21.  Past Medical History:  Diagnosis Date   Diabetes mellitus without complication ([place])    Hypertension    Past Surgical History:  Procedure Laterality Date   CESAREAN SECTION     Family History  Problem Relation Age of Onset   Diabetes Other    Hypertension Other    Social History:  reports that she has never smoked. She has never used smokeless tobacco. She reports that she does not drink alcohol and does not use drugs.  Allergies: No Known Allergies  Medications: I have reviewed the patient's current medications. Prior to Admission:  No medications prior to admission.   Scheduled:  cyanocobalamin  1,000 mcg Intramuscular Daily   guaiFENesin  1,200 mg Oral BID   Continuous:  cefTRIAXone (ROCEPHIN)  IV 2 g (12/12/22 1518)    lactated ringers 75 mL/hr at 12/12/22 1518   [id number], levalbuterol, metoprolol tartrate, ondansetron **OR** ondansetron (ZOFRAN) IV, polyethylene glycol, traZODone  Results for orders placed or performed during the hospital encounter of 12/10/22 (from the past 48 hour(s))  CBC with Differential     Status: Abnormal   Collection Time: 12/10/22 10:34 PM  Result Value Ref Range   WBC 12.3 (H) 4.0 - 10.5 K/uL   RBC 4.16 3.87 - 5.11 MIL/uL   Hemoglobin 9.5 (L) 12.0 - 15.0 g/dL   HCT 30.8 (L) 36.0 - 46.0 %   MCV 74.0 (L) 80.0 - 100.0 fL   MCH 22.8 (L) 26.0 - 34.0 pg   MCHC 30.8 30.0 - 36.0 g/dL   RDW 17.7 (H) 11.5 - 15.5 %   Platelets 464 (H) 150 - 400 K/uL   nRBC 0.0 0.0 - 0.2 %   Neutrophils Relative % 71 %   Neutro Abs 8.9 (H) 1.7 - 7.7 K/uL   Lymphocytes Relative 16 %   Lymphs Abs 1.9 0.7 - 4.0 K/uL   Monocytes Relative 9 %   Monocytes Absolute 1.1 (H) 0.1 - 1.0 K/uL   Eosinophils Relative 3 %   Eosinophils Absolute 0.3 0.0 - 0.5 K/uL   Basophils Relative 0 %   Basophils Absolute 0.0 0.0 - 0.1 K/uL   Immature Granulocytes 1 %   Abs Immature Granulocytes 0.06 0.00 - 0.07 K/uL    Comment: Performed at  [facility], [place] [address], [place], [place] [address]  Comprehensive metabolic panel     Status: Abnormal   Collection Time: 12/10/22 10:34 PM  Result Value Ref Range   Sodium 131 (L) 135 - 145 mmol/L   Potassium 3.2 (L) 3.5 - 5.1 mmol/L   Chloride 101 98 - 111 mmol/L   CO2 22 22 - 32 mmol/L   Glucose, Bld 136 (H) 70 - 99 mg/dL    Comment: Glucose reference range applies only to samples taken after fasting for at least 8 hours.   BUN 7 (L) 8 - 23 mg/dL   Creatinine, Ser 0.71 0.44 - 1.00 mg/dL   Calcium 8.0 (L) 8.9 - 10.3 mg/dL   Total Protein 8.0 6.5 - 8.1 g/dL   Albumin 2.8 (L) 3.5 - 5.0 g/dL   AST 17 15 - 41 U/L   ALT 10 0 - 44 U/L   Alkaline Phosphatase 56 38 - 126 U/L   Total Bilirubin 0.7 0.3 - 1.2 mg/dL   GFR, Estimated  >60 >60 mL/min    Comment: (NOTE) Calculated using the CKD-EPI Creatinine Equation (2021)    Anion gap 8 5 - 15    Comment: Performed at [facility], [place] [address] [place], [place] [address]  Brain natriuretic peptide     Status: None   Collection Time: 12/10/22 10:34 PM  Result Value Ref Range   B Natriuretic Peptide 23.4 0.0 - 100.0 pg/mL    Comment: Performed at [facility], [place] [address] [place], [place] [address]  Troponin I (High Sensitivity)     Status: Abnormal   Collection Time: 12/10/22 10:36 PM  Result Value Ref Range   Troponin I (High Sensitivity) 21 (H) <18 ng/L    Comment: (NOTE) Elevated high sensitivity troponin I (hsTnI) values and significant  changes across serial measurements may suggest ACS but many other  chronic and acute conditions are known to elevate hsTnI results.  Refer to the "Links" section for chest pain algorithms and additional  guidance. Performed at [facility], [place] [address], [place], [place] [address]   Resp panel by RT-PCR (RSV, Flu A&B, Covid) Anterior Nasal Swab     Status: None   Collection Time: 12/10/22 10:52 PM   Specimen: Anterior Nasal Swab  Result Value Ref Range   SARS Coronavirus 2 by RT PCR NEGATIVE NEGATIVE    Comment: (NOTE) SARS-CoV-2 target nucleic acids are NOT DETECTED.  The SARS-CoV-2 RNA is generally detectable in upper respiratory specimens during the acute phase of infection. The lowest concentration of SARS-CoV-2 viral copies this assay can detect is 138 copies/mL. A negative result does not preclude SARS-Cov-2 infection and should not be used as the sole basis for treatment or other patient management decisions. A negative result may occur with  improper specimen collection/handling, submission of specimen other than nasopharyngeal swab, presence of viral mutation(s) within the areas targeted by this assay, and inadequate number of viral copies(<138  copies/mL). A negative result must be combined with clinical observations, patient history, and epidemiological information. The expected result is Negative.  Fact Sheet for Patients:  EntrepreneurPulse.com.au  Fact Sheet for Healthcare Providers:  IncredibleEmployment.be  This test is no t yet approved or cleared by the [place] FDA and  has been authorized for detection and/or diagnosis of SARS-CoV-2 by FDA under an Emergency Use Authorization (EUA). This EUA will remain  in effect (meaning this test can be used) for the duration  of the COVID-19 declaration under Section 564(b)(1) of the Act, 21 U.S.C.section 360bbb-3(b)(1), unless the authorization is terminated  or revoked sooner.       Influenza A by PCR NEGATIVE NEGATIVE   Influenza B by PCR NEGATIVE NEGATIVE    Comment: (NOTE) The Xpert Xpress SARS-CoV-2/FLU/RSV plus assay is intended as an aid in the diagnosis of influenza from Nasopharyngeal swab specimens and should not be used as a sole basis for treatment. Nasal washings and aspirates are unacceptable for Xpert Xpress SARS-CoV-2/FLU/RSV testing.  Fact Sheet for Patients: EntrepreneurPulse.com.au  Fact Sheet for Healthcare Providers: IncredibleEmployment.be  This test is not yet approved or cleared by the [place] FDA and has been authorized for detection and/or diagnosis of SARS-CoV-2 by FDA under an Emergency Use Authorization (EUA). This EUA will remain in effect (meaning this test can be used) for the duration of the COVID-19 declaration under Section 564(b)(1) of the Act, 21 U.S.C. section 360bbb-3(b)(1), unless the authorization is terminated or revoked.     Resp Syncytial Virus by PCR NEGATIVE NEGATIVE    Comment: (NOTE) Fact Sheet for Patients: EntrepreneurPulse.com.au  Fact Sheet for Healthcare  Providers: IncredibleEmployment.be  This test is not yet approved or cleared by the [place] FDA and has been authorized for detection and/or diagnosis of SARS-CoV-2 by FDA under an Emergency Use Authorization (EUA). This EUA will remain in effect (meaning this test can be used) for the duration of the COVID-19 declaration under Section 564(b)(1) of the Act, 21 U.S.C. section 360bbb-3(b)(1), unless the authorization is terminated or revoked.  Performed at [facility], [place] [address], [place], [place] [address]   Respiratory (~20 pathogens) panel by PCR     Status: None   Collection Time: 12/10/22 10:52 PM   Specimen: Nasopharyngeal Swab; Respiratory  Result Value Ref Range   Adenovirus NOT DETECTED NOT DETECTED   Coronavirus 229E NOT DETECTED NOT DETECTED    Comment: (NOTE) The Coronavirus on the Respiratory Panel, DOES NOT test for the novel  Coronavirus (2019 nCoV)    Coronavirus HKU1 NOT DETECTED NOT DETECTED   Coronavirus NL63 NOT DETECTED NOT DETECTED   Coronavirus OC43 NOT DETECTED NOT DETECTED   Metapneumovirus NOT DETECTED NOT DETECTED   Rhinovirus / Enterovirus NOT DETECTED NOT DETECTED   Influenza A NOT DETECTED NOT DETECTED   Influenza B NOT DETECTED NOT DETECTED   Parainfluenza Virus 1 NOT DETECTED NOT DETECTED   Parainfluenza Virus 2 NOT DETECTED NOT DETECTED   Parainfluenza Virus 3 NOT DETECTED NOT DETECTED   Parainfluenza Virus 4 NOT DETECTED NOT DETECTED   Respiratory Syncytial Virus NOT DETECTED NOT DETECTED   Bordetella pertussis NOT DETECTED NOT DETECTED   Bordetella Parapertussis NOT DETECTED NOT DETECTED   Chlamydophila pneumoniae NOT DETECTED NOT DETECTED   Mycoplasma pneumoniae NOT DETECTED NOT DETECTED    Comment: Performed at [facility], [place] [address] [place], [place] [address]  Blood culture (routine x 2)     Status: None (Preliminary result)   Collection Time: 12/10/22 11:21 PM   Specimen: BLOOD   Result Value Ref Range   Specimen Description      BLOOD RIGHT ANTECUBITAL Performed at [place] [address] [place], [place] [address]    Special Requests      BOTTLES DRAWN AEROBIC AND ANAEROBIC Blood Culture adequate volume Performed at [place] [address] [place], [place] [address]    Culture      NO GROWTH 1  DAY Performed at [facility], [place] [address] [place], [place] [address]    Report Status PENDING   Blood culture (routine x 2)     Status: None (Preliminary result)   Collection Time: 12/10/22 11:32 PM   Specimen: BLOOD  Result Value Ref Range   Specimen Description      BLOOD LEFT ANTECUBITAL Performed at [facility], [place] [address], [place], [place] [address]    Special Requests      BOTTLES DRAWN AEROBIC AND ANAEROBIC Blood Culture adequate volume Performed at [place] [address] [place], [place] [address]    Culture      NO GROWTH 1 DAY Performed at [place] [address], [place], [place] [address]    Report Status PENDING   Lactic acid, plasma     Status: None   Collection Time: 12/10/22 11:32 PM  Result Value Ref Range   Lactic Acid, Venous 1.5 0.5 - 1.9 mmol/L    Comment: Performed at [facility], [place] [address], [place], [place] [address]  Troponin I (High Sensitivity)     Status: Abnormal   Collection Time: 12/11/22 12:55 AM  Result Value Ref Range   Troponin I (High Sensitivity) 27 (H) <18 ng/L    Comment: (NOTE) Elevated high sensitivity troponin I (hsTnI) values and significant  changes across serial measurements may suggest ACS but many other  chronic and acute conditions are known to elevate hsTnI results.  Refer to the "Links" section for chest pain algorithms and additional  guidance. Performed at [facility], [place] [address] [place], [place] [address]   Procalcitonin      Status: None   Collection Time: 12/11/22  1:12 AM  Result Value Ref Range   Procalcitonin <0.10 ng/mL    Comment:        Interpretation: PCT (Procalcitonin) <= 0.5 ng/mL: Systemic infection (sepsis) is not likely. Local bacterial infection is possible. (NOTE)       Sepsis PCT Algorithm           Lower Respiratory Tract                                      Infection PCT Algorithm    ----------------------------     ----------------------------         PCT < 0.25 ng/mL                PCT < 0.10 ng/mL          Strongly encourage             Strongly discourage   discontinuation of antibiotics    initiation of antibiotics    ----------------------------     -----------------------------       PCT 0.25 - 0.50 ng/mL            PCT 0.10 - 0.25 ng/mL               OR       >80% decrease in PCT            Discourage initiation of                                            antibiotics  Encourage discontinuation           of antibiotics    ----------------------------     -----------------------------         PCT >= 0.50 ng/mL              PCT 0.26 - 0.50 ng/mL               AND        <80% decrease in PCT             Encourage initiation of                                             antibiotics       Encourage continuation           of antibiotics    ----------------------------     -----------------------------        PCT >= 0.50 ng/mL                  PCT > 0.50 ng/mL               AND         increase in PCT                  Strongly encourage                                      initiation of antibiotics    Strongly encourage escalation           of antibiotics                                     -----------------------------                                           PCT <= 0.25 ng/mL                                                 OR                                        > 80% decrease in PCT                                      Discontinue / Do not initiate                                              antibiotics  Performed at [place] [address], [place], [place] [address]   Magnesium     Status: None   Collection Time: 12/11/22  1:12 AM  Result  Value Ref Range   Magnesium 1.9 1.7 - 2.4 mg/dL    Comment: Performed at [facility], [place] [address], [place], [place] [address]  TSH     Status: None   Collection Time: 12/11/22  2:40 AM  Result Value Ref Range   TSH 0.425 0.350 - 4.500 uIU/mL    Comment: Performed by a 3rd Generation assay with a functional sensitivity of <=0.01 uIU/mL. Performed at [facility], [place] [address], [place], [place] [address]   T4, free     Status: Abnormal   Collection Time: 12/11/22  2:40 AM  Result Value Ref Range   Free T4 1.13 (H) 0.61 - 1.12 ng/dL    Comment: (NOTE) Biotin ingestion may interfere with free T4 tests. If the results are inconsistent with the TSH level, previous test results, or the clinical presentation, then consider biotin interference. If needed, order repeat testing after stopping biotin. Performed at [facility], [place] [address], [place], [place] [address]   C-reactive protein     Status: Abnormal   Collection Time: 12/11/22  2:49 AM  Result Value Ref Range   CRP 6.2 (H) <1.0 mg/dL    Comment: Performed at [place] [address], [place], [place] [address]  Urinalysis, Routine w reflex microscopic -Urine, Clean Catch     Status: Abnormal   Collection Time: 12/11/22  3:22 AM  Result Value Ref Range   Color, Urine YELLOW YELLOW   APPearance CLEAR CLEAR   Specific Gravity, Urine <=1.005 1.005 - 1.030   pH 5.0 5.0 - 8.0   Glucose, UA NEGATIVE NEGATIVE mg/dL   Hgb urine dipstick SMALL (A) NEGATIVE   Bilirubin Urine NEGATIVE NEGATIVE   Ketones, ur 5 (A) NEGATIVE mg/dL   Protein, ur NEGATIVE NEGATIVE mg/dL   Nitrite NEGATIVE NEGATIVE   Leukocytes,Ua NEGATIVE NEGATIVE   RBC / HPF 0-5 0 - 5 RBC/hpf   WBC, UA 0-5  0 - 5 WBC/hpf   Bacteria, UA NONE SEEN NONE SEEN   Squamous Epithelial / HPF 0-5 0 - 5 /HPF   Mucus PRESENT    Hyaline Casts, UA PRESENT     Comment: Performed at [facility], [place] [address] [place], [place] [address]  Group A Strep by PCR     Status: None   Collection Time: 12/11/22  8:27 AM   Specimen: Throat; Sterile Swab  Result Value Ref Range   Group A Strep by PCR NOT DETECTED NOT DETECTED    Comment: Performed at [facility], [place] [address] [place], [place] [address]  HIV Antibody (routine testing w rflx)     Status: None   Collection Time: 12/11/22 12:00 PM  Result Value Ref Range   HIV Screen 4th Generation wRfx Non Reactive Non Reactive    Comment: Performed at [facility], [place] [address] [place], [place] [address]  Vitamin B12     Status: None   Collection Time: 12/11/22 12:00 PM  Result Value Ref Range   Vitamin B-12 231 180 - 914 pg/mL    Comment: (NOTE) This assay is not validated for testing neonatal or myeloproliferative syndrome specimens for Vitamin B12 levels. Performed at [facility], [place] [address] [place], [place] [address]   Folate     Status: None   Collection Time: 12/11/22 12:00 PM  Result Value Ref Range   Folate 11.2 >5.9 ng/mL    Comment: Performed at [facility]  [place] [address], [place], [place] [address]  Iron and TIBC     Status: Abnormal   Collection Time: 12/11/22 12:00 PM  Result Value Ref Range   Iron 21 (L) 28 - 170 ug/dL   TIBC 211 (L) 250 - 450 ug/dL   Saturation Ratios 10 (L) 10.4 - 31.8 %   UIBC 190 ug/dL    Comment: Performed at [facility], [place] [address] [place], [place] [address]  Ferritin     Status: None   Collection Time: 12/11/22 12:00 PM  Result Value Ref Range   Ferritin 38 11 - 307 ng/mL    Comment: Performed at [facility], [place] [address], [place], [place] [address]  Reticulocytes      Status: Abnormal   Collection Time: 12/11/22 12:00 PM  Result Value Ref Range   Retic Ct Pct 1.4 0.4 - 3.1 %   RBC. 3.77 (L) 3.87 - 5.11 MIL/uL   Retic Count, Absolute 53.9 19.0 - 186.0 K/uL   Immature Retic Fract 30.0 (H) 2.3 - 15.9 %    Comment: Performed at [facility], [place] [address], [place], [place] [address]  Glucose, capillary     Status: Abnormal   Collection Time: 12/11/22  8:46 PM  Result Value Ref Range   Glucose-Capillary 176 (H) 70 - 99 mg/dL    Comment: Glucose reference range applies only to samples taken after fasting for at least 8 hours.  Comprehensive metabolic panel     Status: Abnormal   Collection Time: 12/12/22  4:23 AM  Result Value Ref Range   Sodium 138 135 - 145 mmol/L    Comment: DELTA CHECK NOTED   Potassium 3.9 3.5 - 5.1 mmol/L   Chloride 104 98 - 111 mmol/L   CO2 25 22 - 32 mmol/L   Glucose, Bld 104 (H) 70 - 99 mg/dL    Comment: Glucose reference range applies only to samples taken after fasting for at least 8 hours.   BUN 10 8 - 23 mg/dL   Creatinine, Ser 0.68 0.44 - 1.00 mg/dL   Calcium 8.3 (L) 8.9 - 10.3 mg/dL   Total Protein 6.7 6.5 - 8.1 g/dL   Albumin 2.4 (L) 3.5 - 5.0 g/dL   AST 14 (L) 15 - 41 U/L   ALT 11 0 - 44 U/L   Alkaline Phosphatase 43 38 - 126 U/L   Total Bilirubin 0.4 0.3 - 1.2 mg/dL   GFR, Estimated >60 >60 mL/min    Comment: (NOTE) Calculated using the CKD-EPI Creatinine Equation (2021)    Anion gap 9 5 - 15    Comment: Performed at [facility], [place] [address], [place], [place] [address]  CBC     Status: Abnormal   Collection Time: 12/12/22  4:23 AM  Result Value Ref Range   WBC 8.9 4.0 - 10.5 K/uL   RBC 3.49 (L) 3.87 - 5.11 MIL/uL   Hemoglobin 7.8 (L) 12.0 - 15.0 g/dL    Comment: Reticulocyte Hemoglobin testing may be clinically indicated, consider ordering this additional test [id number]    HCT 25.6 (L) 36.0 - 46.0 %   MCV 73.4 (L) 80.0 - 100.0 fL   MCH 22.3 (L) 26.0 - 34.0 pg    MCHC 30.5 30.0 - 36.0 g/dL   RDW 17.6 (H) 11.5 - 15.5 %   Platelets 398 150 - 400 K/uL   nRBC 0.0 0.0 - 0.2 %    Comment:  Performed at [facility], [place] [address], [place], [place] [address]  ABO/Rh     Status: None   Collection Time: 12/12/22  4:23 AM  Result Value Ref Range   ABO/RH(D)      A POS Performed at [facility], [place] [address] [place], [place] [address]   Type and screen [place]     Status: None   Collection Time: 12/12/22 12:27 PM  Result Value Ref Range   ABO/RH(D) A POS    Antibody Screen NEG    Sample Expiration      12/15/2022,2359 Performed at [facility], [place] [address] [place], [place] [address]   CBC     Status: Abnormal   Collection Time: 12/12/22 12:27 PM  Result Value Ref Range   WBC 9.9 4.0 - 10.5 K/uL   RBC 4.14 3.87 - 5.11 MIL/uL   Hemoglobin 9.3 (L) 12.0 - 15.0 g/dL   HCT 31.5 (L) 36.0 - 46.0 %   MCV 76.1 (L) 80.0 - 100.0 fL   MCH 22.5 (L) 26.0 - 34.0 pg   MCHC 29.5 (L) 30.0 - 36.0 g/dL   RDW 18.4 (H) 11.5 - 15.5 %   Platelets 332 150 - 400 K/uL   nRBC 0.0 0.0 - 0.2 %    Comment: Performed at [facility], [place] [address] [place], [place] [address]    ECHOCARDIOGRAM COMPLETE  Result Date: 12/12/2022    ECHOCARDIOGRAM REPORT   Patient Name:   TAKEA CARLOW Date of Exam: 12/12/2022 Medical Rec #:  [id number]      Height:       62.0 in Accession #:    [id number]     Weight:       194.5 lb Date of Birth:  13-Jan-1961      BSA:          1.889 m Patient Age:    36 years       BP:           118/71 mmHg Patient Gender: F              HR:           96 bpm. Exam Location:  Inpatient Procedure: 2D Echo, Intracardiac Opacification Agent, Color Doppler and Cardiac            Doppler Indications:    V-Tach  History:        Patient has no prior history of Echocardiogram examinations.                 Arrythmias:Tachycardia, Signs/Symptoms:Dyspnea; Risk                  Factors:Hypertension.  Sonographer:    Marella Chimes Referring Phys: [id number] [place]  1. Left ventricular ejection fraction, by estimation, is 55 to 60%. The left ventricle has normal function. The left ventricle has no regional wall motion abnormalities. Left ventricular diastolic parameters are consistent with Grade I diastolic dysfunction (impaired relaxation).  2. Right ventricular systolic function is normal. The right ventricular size is normal.  3. The mitral valve is normal in structure. No evidence of mitral valve regurgitation. No evidence of mitral stenosis.  4. The aortic valve is normal in structure. Aortic valve regurgitation is not visualized. No aortic stenosis is present.  5. The inferior vena cava is normal in size with greater than 50% respiratory variability, suggesting right atrial pressure of 3 mmHg.  6. Technically limited study due to poor sound wave transmission. FINDINGS  Left Ventricle: Left ventricular ejection fraction, by estimation, is 55 to 60%. The left ventricle has normal function. The left ventricle has no regional wall motion abnormalities. The left ventricular internal cavity size was normal in size. There is  no left ventricular hypertrophy. Left ventricular diastolic parameters are consistent with Grade I diastolic dysfunction (impaired relaxation). Right Ventricle: The right ventricular size is normal. No increase in right ventricular wall thickness. Right ventricular systolic function is normal. Left Atrium: Left atrial size was normal in size. Right Atrium: Right atrial size was normal in size. Pericardium: There is no evidence of pericardial effusion. Mitral Valve: The mitral valve is normal in structure. No evidence of mitral valve regurgitation. No evidence of mitral valve stenosis. Tricuspid Valve: The tricuspid valve is normal in structure. Tricuspid valve regurgitation is trivial. No evidence of tricuspid stenosis. Aortic Valve: The aortic  valve is normal in structure. Aortic valve regurgitation is not visualized. No aortic stenosis is present. Aortic valve mean gradient measures 4.0 mmHg. Aortic valve peak gradient measures 8.1 mmHg. Aortic valve area, by VTI measures 2.13 cm. Pulmonic Valve: The pulmonic valve was normal in structure. Pulmonic valve regurgitation is not visualized. No evidence of pulmonic stenosis. Aorta: The aortic root is normal in size and structure. Venous: The inferior vena cava is normal in size with greater than 50% respiratory variability, suggesting right atrial pressure of 3 mmHg. IAS/Shunts: No atrial level shunt detected by color flow Doppler.  LEFT VENTRICLE PLAX 2D LVIDd:         5.20 cm     Diastology LVIDs:         3.80 cm     LV e' medial:    8.92 cm/s LV PW:         1.00 cm     LV E/e' medial:  8.5 LV IVS:        0.90 cm     LV e' lateral:   7.62 cm/s LVOT diam:     1.90 cm     LV E/e' lateral: 9.9 LV SV:         57 LV SV Index:   30 LVOT Area:     2.84 cm  LV Volumes (MOD) LV vol d, MOD A4C: 76.3 ml LV vol s, MOD A4C: 40.7 ml LV SV MOD A4C:     76.3 ml RIGHT VENTRICLE RV S prime:     14.10 cm/s TAPSE (M-mode): 2.8 cm LEFT ATRIUM           Index        RIGHT ATRIUM           Index LA diam:      2.70 cm 1.43 cm/m   RA Area:     13.30 cm LA Vol (A4C): 52.1 ml 27.58 ml/m  RA Volume:   27.60 ml  14.61 ml/m  AORTIC VALVE AV Area (Vmax):    2.04 cm AV Area (Vmean):   2.23 cm AV Area (VTI):     2.13 cm AV Vmax:           142.00 cm/s AV Vmean:          91.100 cm/s AV VTI:            0.269 m AV Peak Grad:      8.1 mmHg AV Mean Grad:      4.0 mmHg LVOT Vmax:  102.00 cm/s LVOT Vmean:        71.700 cm/s LVOT VTI:          0.202 m LVOT/AV VTI ratio: 0.75  AORTA Ao Root diam: 2.60 cm Ao Asc diam:  3.00 cm MITRAL VALVE MV Area (PHT): 3.23 cm    SHUNTS MV Decel Time: 235 msec    Systemic VTI:  0.20 m MV E velocity: 75.40 cm/s  Systemic Diam: 1.90 cm MV A velocity: 96.40 cm/s MV E/A ratio:  0.78 Tracey Bickers MD  Electronically signed by Tracey Bickers MD Signature Date/Time: 12/12/2022/2:55:51 PM    Final    CT Angio Chest PE W and/or Wo Contrast  Result Date: 12/11/2022 CLINICAL DATA:  Pulmonary embolism (PE) suspected, high prob. Shortness of breath, cough EXAM: CT ANGIOGRAPHY CHEST WITH CONTRAST TECHNIQUE: Multidetector CT imaging of the chest was performed using the standard protocol during bolus administration of intravenous contrast. Multiplanar CT image reconstructions and MIPs were obtained to evaluate the vascular anatomy. RADIATION DOSE REDUCTION: This exam was performed according to the departmental dose-optimization program which includes automated exposure control, adjustment of the mA and/or kV according to patient size and/or use of iterative reconstruction technique. CONTRAST:  58mL OMNIPAQUE IOHEXOL 350 MG/ML SOLN COMPARISON:  None Available. FINDINGS: Cardiovascular: No filling defects in the pulmonary arteries to suggest pulmonary emboli. Heart borderline in size. Aorta normal caliber. Mediastinum/Nodes: No mediastinal, hilar, or axillary adenopathy. Trachea and esophagus are unremarkable. Thyroid unremarkable. Lungs/Pleura: No confluent opacities or effusions. Upper Abdomen: Diffuse low-density throughout the liver compatible with fatty infiltration. Central low-density in the liver measures 2.6 cm, likely cyst. Musculoskeletal: Chest wall soft tissues are unremarkable. No acute bony abnormality. Review of the MIP images confirms the above findings. IMPRESSION: No evidence of pulmonary embolus. No acute cardiopulmonary disease. Hepatic steatosis Electronically Signed   By: Rolm Baptise M.D.   On: 12/11/2022 00:36   DG Chest 2 View  Result Date: 12/10/2022 CLINICAL DATA:  Cough and shortness of breath. EXAM: CHEST - 2 VIEW COMPARISON:  December 05, 2022 FINDINGS: The heart size and mediastinal contours are within normal limits. Mild areas of atelectasis and/or early infiltrate are seen within the  bilateral lung bases, left greater than right. This represents a new finding when compared to the prior study. There is no evidence of a pleural effusion or pneumothorax. The visualized skeletal structures are unremarkable. IMPRESSION: Mild bibasilar atelectasis and/or early infiltrate, left greater than right. Electronically Signed   By: Virgina Norfolk M.D.   On: 12/10/2022 22:56    Review of Systems  Constitutional:  Positive for fatigue. Negative for activity change, appetite change, chills, diaphoresis, fever and unexpected weight change.  HENT: Negative.    Eyes: Negative.   Respiratory: Negative.    Cardiovascular: Negative.   Gastrointestinal:  Positive for anal bleeding, blood in stool and diarrhea. Negative for abdominal distention, abdominal pain, constipation, nausea and rectal pain.  Endocrine: Negative.   Genitourinary: Negative.   Musculoskeletal: Negative.   Neurological: Negative.   Hematological: Negative.   Psychiatric/Behavioral: Negative.     Blood pressure 119/65, pulse 96, temperature 99.3 F (37.4 C), temperature source Oral, resp. rate (!) 27, height 5\' 2"  (1.575 m), weight 88.2 kg, SpO2 98 %. Physical Exam Constitutional:      General: She is not in acute distress.    Appearance: She is well-developed. She is morbidly obese.  HENT:     Head: Normocephalic and atraumatic.     Comments: Her teeth are in  poor repair  Eyes:     Extraocular Movements: Extraocular movements intact.     Pupils: Pupils are equal, round, and reactive to light.  Cardiovascular:     Rate and Rhythm: Normal rate and regular rhythm.  Pulmonary:     Effort: Pulmonary effort is normal.     Breath sounds: Normal breath sounds.  Abdominal:     General: Abdomen is protuberant. Bowel sounds are normal. There is no distension.     Palpations: Abdomen is soft. There is no shifting dullness, hepatomegaly or mass.     Tenderness: There is no abdominal tenderness.  Skin:    General: Skin is  warm and dry.  Neurological:     General: No focal deficit present.     Mental Status: She is alert and oriented to person, place, and time.   Assessment/Plan: 1) Bloody diarrhea with iron deficiency anemia-stool studies are pending-rule out infectious diarrhea.She will benefit from an EGD/Colonoscopy prior to discharge. 2) Iron deficiency anemia with thrombocytosis. 3) Acute bronchitis-on Ceftriaxone. 4) HTN. 5) AODM. 6) Vitamin B12 deficiency.  7) Morbid obesity. Juanita Craver 12/12/2022, 3:48 PM

## 2022-12-12 NOTE — Progress Notes (Signed)
  Echocardiogram 2D Echocardiogram has been performed.  Tracey Blackburn [name] 12/12/2022, 1:12 PM

## 2022-12-12 NOTE — Plan of Care (Signed)

## 2022-12-12 NOTE — Plan of Care (Signed)
  Problem: Education: Goal: Knowledge of General Education information will improve Description Including pain rating scale, medication(s)/side effects and non-pharmacologic comfort measures Outcome: Progressing   Problem: Health Behavior/Discharge Planning: Goal: Ability to manage health-related needs will improve Outcome: Progressing   

## 2022-12-12 NOTE — Progress Notes (Signed)
PROGRESS NOTE    Tracey Blackburn  [id number] DOB: 07/23/1961 DOA: 12/10/2022 PCP: Patient, No Pcp Per   Brief Narrative: 62 year old-year-old with past medical history significant PMH  for diabetes and hypertension, who has been having upper respiratory symptoms for the last week, she was seen at urgent care and discharged home, presented with worsening shortness of breath, congestion.  She was also found to be tachycardic. CT a chest was negative for PE.  No acute cardiopulmonary disease.  Hepatic asteatosis. She received nebulizer and respiratory distress improved.  Patient also noted to have tachycardia, bloody diarrhea.     Assessment & Plan:   Principal Problem:   Tachycardia Active Problems:   Essential hypertension   Malaise and fatigue   Dyspnea   Thrombocytosis   Normocytic anemia   Leukocytosis  1-Acute Bronchitis; upper respiratory infection: Will start Guaifenesin.  Xopenex PRN  Flutter Valve IV Ceftriaxone.    2-Tachycardia;  Suspect related to dehydration, anemia.  Start IV fluids.  TSH normal.  CT angio negative for PE>   3-Bloody Diarrhea;  Since Sunday.  Hb trending down from 9--7.8 Cycle hb.  Transfuse as needed. Type and screen.  GI Dr Man consulted.  GI pathogen. C Diff, CT abdomen and pelvis.   4-HTN; monitor. Might need to start metoprolol/   5-Thrombocytosis; reactive.   6-Iron deficiency anemia In setting GI bleed.  B 12 deficiency.  Start B 12 supplement.   Hypokalemia; replaced.   Estimated body mass index is 35.57 kg/m as calculated from the following:   Height as of this encounter: 5\' 2"  (1.575 m).   Weight as of this encounter: 88.2 kg.   DVT prophylaxis: SCD Code Status: Full code Family Communication: Crae discussed with daughter who was at bedside.  Disposition Plan:  Status is: Observation The patient remains OBS appropriate and will d/c before 2 midnights.    Consultants:  GI  Procedures:   None  Antimicrobials:    Subjective: She is breathing better, cough improved not as  productive she feels better. She has been having diarrhea since Sunday, bloody, denies abdominal pain.   Objective: Vitals:   12/11/22 2050 12/12/22 0039 12/12/22 0611 12/12/22 1033  BP: (!) 147/72 137/72 (!) 143/81 118/71  Pulse: (!) 116 (!) 110 (!) 106 100  Resp: (!) 22 (!) 22 (!) 24 20  Temp: 98.6 F (37 C) 98.5 F (36.9 C) 98.3 F (36.8 C) 98 F (36.7 C)  TempSrc: Oral Oral Oral Oral  SpO2: 100% 96% 94% 99%  Weight:      Height:        Intake/Output Summary (Last 24 hours) at 12/12/2022 1224 Last data filed at 12/11/2022 1857 Gross per 24 hour  Intake 360 ml  Output --  Net 360 ml   Filed Weights   12/10/22 2215  Weight: 88.2 kg    Examination:  General exam: Appears calm and comfortable  Respiratory system: Clear to auscultation. Respiratory effort normal. Cardiovascular system: S1 & S2 heard, RRR. No JVD, murmurs, rubs, gallops or clicks. No pedal edema. Gastrointestinal system: Abdomen is nondistended, soft and nontender.  Central nervous system: Alert and oriented. Extremities: Symmetric 5 x 5 power.    Data Reviewed: I have personally reviewed following labs and imaging studies  CBC: Recent Labs  Lab 12/05/22 2158 12/10/22 2234 12/12/22 0423  WBC 10.8* 12.3* 8.9  NEUTROABS 7.2 8.9*  --   HGB 9.9* 9.5* 7.8*  HCT 31.2* 30.8* 25.6*  MCV 72.4* 74.0* 73.4*  PLT 399 464* [id number]   Basic Metabolic Panel: Recent Labs  Lab 12/05/22 2158 12/10/22 2234 12/11/22 0112 12/12/22 0423  NA 131* 131*  --  138  K 3.2* 3.2*  --  3.9  CL 100 101  --  104  CO2 22 22  --  25  GLUCOSE 112* 136*  --  104*  BUN 9 7*  --  10  CREATININE 0.97 0.71  --  0.68  CALCIUM 8.3* 8.0*  --  8.3*  MG  --   --  1.9  --    GFR: Estimated Creatinine Clearance: 76.1 mL/min (by C-G formula based on SCr of 0.68 mg/dL). Liver Function Tests: Recent Labs  Lab 12/05/22 2158 12/10/22 2234  12/12/22 0423  AST 23 17 14*  ALT 15 10 11   ALKPHOS 49 56 43  BILITOT 0.7 0.7 0.4  PROT 8.0 8.0 6.7  ALBUMIN 3.0* 2.8* 2.4*   No results for input(s): "LIPASE", "AMYLASE" in the last 168 hours. No results for input(s): "AMMONIA" in the last 168 hours. Coagulation Profile: No results for input(s): "INR", "PROTIME" in the last 168 hours. Cardiac Enzymes: No results for input(s): "CKTOTAL", "CKMB", "CKMBINDEX", "TROPONINI" in the last 168 hours. BNP (last 3 results) No results for input(s): "PROBNP" in the last 8760 hours. HbA1C: No results for input(s): "HGBA1C" in the last 72 hours. CBG: Recent Labs  Lab 12/11/22 2046  GLUCAP 176*   Lipid Profile: No results for input(s): "CHOL", "HDL", "LDLCALC", "TRIG", "CHOLHDL", "LDLDIRECT" in the last 72 hours. Thyroid Function Tests: Recent Labs    12/11/22 0240  TSH 0.425  FREET4 1.13*   Anemia Panel: Recent Labs    12/11/22 1200  VITAMINB12 231  FOLATE 11.2  FERRITIN 38  TIBC 211*  IRON 21*  RETICCTPCT 1.4   Sepsis Labs: Recent Labs  Lab 12/05/22 2159 12/10/22 2332 12/11/22 0112  PROCALCITON  --   --  <0.10  LATICACIDVEN 1.2 1.5  --     Recent Results (from the past 240 hour(s))  Resp panel by RT-PCR (RSV, Flu A&B, Covid) Anterior Nasal Swab     Status: None   Collection Time: 12/05/22  7:08 PM   Specimen: Anterior Nasal Swab  Result Value Ref Range Status   SARS Coronavirus 2 by RT PCR NEGATIVE NEGATIVE Final    Comment: (NOTE) SARS-CoV-2 target nucleic acids are NOT DETECTED.  The SARS-CoV-2 RNA is generally detectable in upper respiratory specimens during the acute phase of infection. The lowest concentration of SARS-CoV-2 viral copies this assay can detect is 138 copies/mL. A negative result does not preclude SARS-Cov-2 infection and should not be used as the sole basis for treatment or other patient management decisions. A negative result may occur with  improper specimen collection/handling,  submission of specimen other than nasopharyngeal swab, presence of viral mutation(s) within the areas targeted by this assay, and inadequate number of viral copies(<138 copies/mL). A negative result must be combined with clinical observations, patient history, and epidemiological information. The expected result is Negative.  Fact Sheet for Patients:  EntrepreneurPulse.com.au  Fact Sheet for Healthcare Providers:  IncredibleEmployment.be  This test is no t yet approved or cleared by the [place] FDA and  has been authorized for detection and/or diagnosis of SARS-CoV-2 by FDA under an Emergency Use Authorization (EUA). This EUA will remain  in effect (meaning this test can be used) for the duration of the COVID-19 declaration under Section 564(b)(1) of the Act, 21 U.S.C.section 360bbb-3(b)(1), unless the authorization  is terminated  or revoked sooner.       Influenza A by PCR NEGATIVE NEGATIVE Final   Influenza B by PCR NEGATIVE NEGATIVE Final    Comment: (NOTE) The Xpert Xpress SARS-CoV-2/FLU/RSV plus assay is intended as an aid in the diagnosis of influenza from Nasopharyngeal swab specimens and should not be used as a sole basis for treatment. Nasal washings and aspirates are unacceptable for Xpert Xpress SARS-CoV-2/FLU/RSV testing.  Fact Sheet for Patients: EntrepreneurPulse.com.au  Fact Sheet for Healthcare Providers: IncredibleEmployment.be  This test is not yet approved or cleared by the [place] FDA and has been authorized for detection and/or diagnosis of SARS-CoV-2 by FDA under an Emergency Use Authorization (EUA). This EUA will remain in effect (meaning this test can be used) for the duration of the COVID-19 declaration under Section 564(b)(1) of the Act, 21 U.S.C. section 360bbb-3(b)(1), unless the authorization is terminated or revoked.     Resp Syncytial Virus by PCR NEGATIVE  NEGATIVE Final    Comment: (NOTE) Fact Sheet for Patients: EntrepreneurPulse.com.au  Fact Sheet for Healthcare Providers: IncredibleEmployment.be  This test is not yet approved or cleared by the [place] FDA and has been authorized for detection and/or diagnosis of SARS-CoV-2 by FDA under an Emergency Use Authorization (EUA). This EUA will remain in effect (meaning this test can be used) for the duration of the COVID-19 declaration under Section 564(b)(1) of the Act, 21 U.S.C. section 360bbb-3(b)(1), unless the authorization is terminated or revoked.  Performed at [facility], [address] [place], [place] [address]   Resp panel by RT-PCR (RSV, Flu A&B, Covid) Anterior Nasal Swab     Status: None   Collection Time: 12/10/22 10:52 PM   Specimen: Anterior Nasal Swab  Result Value Ref Range Status   SARS Coronavirus 2 by RT PCR NEGATIVE NEGATIVE Final    Comment: (NOTE) SARS-CoV-2 target nucleic acids are NOT DETECTED.  The SARS-CoV-2 RNA is generally detectable in upper respiratory specimens during the acute phase of infection. The lowest concentration of SARS-CoV-2 viral copies this assay can detect is 138 copies/mL. A negative result does not preclude SARS-Cov-2 infection and should not be used as the sole basis for treatment or other patient management decisions. A negative result may occur with  improper specimen collection/handling, submission of specimen other than nasopharyngeal swab, presence of viral mutation(s) within the areas targeted by this assay, and inadequate number of viral copies(<138 copies/mL). A negative result must be combined with clinical observations, patient history, and epidemiological information. The expected result is Negative.  Fact Sheet for Patients:  EntrepreneurPulse.com.au  Fact Sheet for Healthcare Providers:  IncredibleEmployment.be  This test is  no t yet approved or cleared by the [place] FDA and  has been authorized for detection and/or diagnosis of SARS-CoV-2 by FDA under an Emergency Use Authorization (EUA). This EUA will remain  in effect (meaning this test can be used) for the duration of the COVID-19 declaration under Section 564(b)(1) of the Act, 21 U.S.C.section 360bbb-3(b)(1), unless the authorization is terminated  or revoked sooner.       Influenza A by PCR NEGATIVE NEGATIVE Final   Influenza B by PCR NEGATIVE NEGATIVE Final    Comment: (NOTE) The Xpert Xpress SARS-CoV-2/FLU/RSV plus assay is intended as an aid in the diagnosis of influenza from Nasopharyngeal swab specimens and should not be used as a sole basis for treatment. Nasal washings and aspirates are unacceptable for Xpert Xpress SARS-CoV-2/FLU/RSV testing.  Fact Sheet for  Patients: EntrepreneurPulse.com.au  Fact Sheet for Healthcare Providers: IncredibleEmployment.be  This test is not yet approved or cleared by the [place] FDA and has been authorized for detection and/or diagnosis of SARS-CoV-2 by FDA under an Emergency Use Authorization (EUA). This EUA will remain in effect (meaning this test can be used) for the duration of the COVID-19 declaration under Section 564(b)(1) of the Act, 21 U.S.C. section 360bbb-3(b)(1), unless the authorization is terminated or revoked.     Resp Syncytial Virus by PCR NEGATIVE NEGATIVE Final    Comment: (NOTE) Fact Sheet for Patients: EntrepreneurPulse.com.au  Fact Sheet for Healthcare Providers: IncredibleEmployment.be  This test is not yet approved or cleared by the [place] FDA and has been authorized for detection and/or diagnosis of SARS-CoV-2 by FDA under an Emergency Use Authorization (EUA). This EUA will remain in effect (meaning this test can be used) for the duration of the COVID-19 declaration under Section  564(b)(1) of the Act, 21 U.S.C. section 360bbb-3(b)(1), unless the authorization is terminated or revoked.  Performed at [facility], [place] [address], [place], [place] [address]   Respiratory (~20 pathogens) panel by PCR     Status: None   Collection Time: 12/10/22 10:52 PM   Specimen: Nasopharyngeal Swab; Respiratory  Result Value Ref Range Status   Adenovirus NOT DETECTED NOT DETECTED Final   Coronavirus 229E NOT DETECTED NOT DETECTED Final    Comment: (NOTE) The Coronavirus on the Respiratory Panel, DOES NOT test for the novel  Coronavirus (2019 nCoV)    Coronavirus HKU1 NOT DETECTED NOT DETECTED Final   Coronavirus NL63 NOT DETECTED NOT DETECTED Final   Coronavirus OC43 NOT DETECTED NOT DETECTED Final   Metapneumovirus NOT DETECTED NOT DETECTED Final   Rhinovirus / Enterovirus NOT DETECTED NOT DETECTED Final   Influenza A NOT DETECTED NOT DETECTED Final   Influenza B NOT DETECTED NOT DETECTED Final   Parainfluenza Virus 1 NOT DETECTED NOT DETECTED Final   Parainfluenza Virus 2 NOT DETECTED NOT DETECTED Final   Parainfluenza Virus 3 NOT DETECTED NOT DETECTED Final   Parainfluenza Virus 4 NOT DETECTED NOT DETECTED Final   Respiratory Syncytial Virus NOT DETECTED NOT DETECTED Final   Bordetella pertussis NOT DETECTED NOT DETECTED Final   Bordetella Parapertussis NOT DETECTED NOT DETECTED Final   Chlamydophila pneumoniae NOT DETECTED NOT DETECTED Final   Mycoplasma pneumoniae NOT DETECTED NOT DETECTED Final    Comment: Performed at [facility], [place]. [address], [place], [place] [address]  Blood culture (routine x 2)     Status: None (Preliminary result)   Collection Time: 12/10/22 11:21 PM   Specimen: BLOOD  Result Value Ref Range Status   Specimen Description   Final    BLOOD RIGHT ANTECUBITAL Performed at [place] [address] [place], [place] [address]    Special Requests   Final    BOTTLES DRAWN AEROBIC AND ANAEROBIC  Blood Culture adequate volume Performed at [place] [address], [place], [place] [address]    Culture   Final    NO GROWTH 1 DAY Performed at [facility], [place] [address], [place], [place] [address]    Report Status PENDING  Incomplete  Blood culture (routine x 2)     Status: None (Preliminary result)   Collection Time: 12/10/22 11:32 PM   Specimen: BLOOD  Result Value Ref Range Status   Specimen Description   Final    BLOOD LEFT ANTECUBITAL Performed at [facility]  [place] [address], [place], [place] [address]    Special Requests   Final    BOTTLES DRAWN AEROBIC AND ANAEROBIC Blood Culture adequate volume Performed at [place] [address] [place], [place] [address]    Culture   Final    NO GROWTH 1 DAY Performed at [facility], [place] [address] [place], [place] [address]    Report Status PENDING  Incomplete  Group A Strep by PCR     Status: None   Collection Time: 12/11/22  8:27 AM   Specimen: Throat; Sterile Swab  Result Value Ref Range Status   Group A Strep by PCR NOT DETECTED NOT DETECTED Final    Comment: Performed at [facility], [place] [address], [place], [place] [address]         Radiology Studies: CT Angio Chest PE W and/or Wo Contrast  Result Date: 12/11/2022 CLINICAL DATA:  Pulmonary embolism (PE) suspected, high prob. Shortness of breath, cough EXAM: CT ANGIOGRAPHY CHEST WITH CONTRAST TECHNIQUE: Multidetector CT imaging of the chest was performed using the standard protocol during bolus administration of intravenous contrast. Multiplanar CT image reconstructions and MIPs were obtained to evaluate the vascular anatomy. RADIATION DOSE REDUCTION: This exam was performed according to the departmental dose-optimization program which includes automated exposure control, adjustment of the mA and/or kV according to patient size and/or use of iterative reconstruction  technique. CONTRAST:  59mL OMNIPAQUE IOHEXOL 350 MG/ML SOLN COMPARISON:  None Available. FINDINGS: Cardiovascular: No filling defects in the pulmonary arteries to suggest pulmonary emboli. Heart borderline in size. Aorta normal caliber. Mediastinum/Nodes: No mediastinal, hilar, or axillary adenopathy. Trachea and esophagus are unremarkable. Thyroid unremarkable. Lungs/Pleura: No confluent opacities or effusions. Upper Abdomen: Diffuse low-density throughout the liver compatible with fatty infiltration. Central low-density in the liver measures 2.6 cm, likely cyst. Musculoskeletal: Chest wall soft tissues are unremarkable. No acute bony abnormality. Review of the MIP images confirms the above findings. IMPRESSION: No evidence of pulmonary embolus. No acute cardiopulmonary disease. Hepatic steatosis Electronically Signed   By: Rolm Baptise M.D.   On: 12/11/2022 00:36   DG Chest 2 View  Result Date: 12/10/2022 CLINICAL DATA:  Cough and shortness of breath. EXAM: CHEST - 2 VIEW COMPARISON:  December 05, 2022 FINDINGS: The heart size and mediastinal contours are within normal limits. Mild areas of atelectasis and/or early infiltrate are seen within the bilateral lung bases, left greater than right. This represents a new finding when compared to the prior study. There is no evidence of a pleural effusion or pneumothorax. The visualized skeletal structures are unremarkable. IMPRESSION: Mild bibasilar atelectasis and/or early infiltrate, left greater than right. Electronically Signed   By: Virgina Norfolk M.D.   On: 12/10/2022 22:56        Scheduled Meds:  cyanocobalamin  1,000 mcg Intramuscular Daily   guaiFENesin  1,200 mg Oral BID   Continuous Infusions:  cefTRIAXone (ROCEPHIN)  IV     lactated ringers       LOS: 0 days    Time spent: 35 minutes.     Elmarie Shiley, MD [facility]   If 7PM-7AM, please contact night-coverage [email or web address]  12/12/2022, 12:24 PM

## 2022-12-13 DIAGNOSIS — R000 Tachycardia, unspecified: Secondary | ICD-10-CM | POA: Diagnosis not present

## 2022-12-13 LAB — PREPARE RBC (CROSSMATCH)

## 2022-12-13 LAB — HEMOGLOBIN AND HEMATOCRIT, BLOOD
HCT: 25.5 % — ABNORMAL LOW (ref 36.0–46.0)
HCT: 24.6 % — ABNORMAL LOW (ref 36.0–46.0)
HCT: 28.8 % — ABNORMAL LOW (ref 36.0–46.0)
Hemoglobin: 7.6 g/dL — ABNORMAL LOW (ref 12.0–15.0)
Hemoglobin: 7.4 g/dL — ABNORMAL LOW (ref 12.0–15.0)
Hemoglobin: 8.8 g/dL — ABNORMAL LOW (ref 12.0–15.0)

## 2022-12-13 MED ORDER — SODIUM CHLORIDE 0.9 % IV SOLN: INTRAVENOUS | Status: DC

## 2022-12-13 MED ORDER — METRONIDAZOLE 500 MG/100ML IV SOLN
500.0000 mg | Freq: Two times a day (BID) | INTRAVENOUS | Status: DC
  Administered 2022-12-13 – 2022-12-15 (×4): 500 mg via INTRAVENOUS
  Filled 2022-12-13 (×4): qty 100

## 2022-12-13 MED ORDER — SODIUM CHLORIDE 0.9% IV SOLUTION: Freq: Once | INTRAVENOUS | Status: AC

## 2022-12-13 MED ORDER — PEG 3350-KCL-NA BICARB-NACL 420 G PO SOLR
4000.0000 mL | Freq: Once | ORAL | Status: AC
  Administered 2022-12-13: 4000 mL via ORAL

## 2022-12-13 NOTE — H&P (View-Only) (Signed)
Subjective: No complaints.  Diarrhea is improving.  Objective: Vital signs in last 24 hours: Temp:  [98 F (36.7 C)-99.3 F (37.4 C)] 99.3 F (37.4 C) (03/21 1406) Pulse Rate:  [93-110] 110 (03/21 1406) Resp:  [18-27] 18 (03/21 1406) BP: (119-142)/(62-76) 142/62 (03/21 1406) SpO2:  [96 %-100 %] 100 % (03/21 1406) Last BM Date : 12/13/22  Intake/Output from previous day: 03/20 0701 - 03/21 0700 In: 1701.9 [P.O.:840; I.V.:761.9; IV Piggyback:100] Out: 0  Intake/Output this shift: No intake/output data recorded.  General appearance: alert and no distress GI: soft, non-tender; bowel sounds normal; no masses,  no organomegaly  Lab Results: Recent Labs    12/10/22 2234 12/12/22 0423 12/12/22 1227 12/12/22 1857 12/13/22 0407 12/13/22 1054  WBC 12.3* 8.9 9.9  --   --   --   HGB 9.5* 7.8* 9.3* 8.0* 7.6* 7.4*  HCT 30.8* 25.6* 31.5* 26.1* 25.5* 24.6*  PLT 464* 398 332  --   --   --    BMET Recent Labs    12/10/22 2234 12/12/22 0423  NA 131* 138  K 3.2* 3.9  CL 101 104  CO2 22 25  GLUCOSE 136* 104*  BUN 7* 10  CREATININE 0.71 0.68  CALCIUM 8.0* 8.3*   LFT Recent Labs    12/12/22 0423  PROT 6.7  ALBUMIN 2.4*  AST 14*  ALT 11  ALKPHOS 43  BILITOT 0.4   PT/INR No results for input(s): "LABPROT", "INR" in the last 72 hours. Hepatitis Panel No results for input(s): "HEPBSAG", "HCVAB", "HEPAIGM", "HEPBIGM" in the last 72 hours. C-Diff Recent Labs    12/12/22 1150  CDIFFTOX NEGATIVE   Fecal Lactopherrin No results for input(s): "FECLLACTOFRN" in the last 72 hours.  Studies/Results: CT ABDOMEN PELVIS W CONTRAST  Result Date: 12/12/2022 CLINICAL DATA:  Bloody diarrhea EXAM: CT ABDOMEN AND PELVIS WITH CONTRAST TECHNIQUE: Multidetector CT imaging of the abdomen and pelvis was performed using the standard protocol following bolus administration of intravenous contrast. RADIATION DOSE REDUCTION: This exam was performed according to the departmental  dose-optimization program which includes automated exposure control, adjustment of the mA and/or kV according to patient size and/or use of iterative reconstruction technique. CONTRAST:  174mL OMNIPAQUE IOHEXOL 300 MG/ML  SOLN COMPARISON:  10/26/2017 FINDINGS: Lower chest: No acute abnormality Hepatobiliary: Scattered cysts in the liver, the largest 2.7 cm centrally. Findings are unchanged since prior study. Gallbladder contracted, grossly unremarkable. Pancreas: No focal abnormality or ductal dilatation. Spleen: No focal abnormality.  Normal size. Adrenals/Urinary Tract: No adrenal abnormality. No focal renal abnormality. No stones or hydronephrosis. Urinary bladder is unremarkable. Stomach/Bowel: There is mild wall thickening throughout the entire colon compatible with colitis, best seen from the distal transverse colon through the sigmoid colon. Stomach and small bowel decompressed, unremarkable. Vascular/Lymphatic: No evidence of aneurysm or adenopathy. Reproductive: Uterus and adnexa unremarkable.  No mass. Other: No free fluid or free air. Musculoskeletal: No acute bony abnormality. IMPRESSION: Diffuse colonic wall thickening, most pronounced in the left colon. Findings compatible pancolitis, most likely infectious or inflammatory. Electronically Signed   By: Rolm Baptise M.D.   On: 12/12/2022 23:06   ECHOCARDIOGRAM COMPLETE  Result Date: 12/12/2022    ECHOCARDIOGRAM REPORT   Patient Name:   Tracey Blackburn Date of Exam: 12/12/2022 Medical Rec #:  [id number]      Height:       62.0 in Accession #:    [id number]     Weight:       194.5 lb Date of  Birth:  01/26/1961      BSA:          1.889 m Patient Age:    62 years       BP:           118/71 mmHg Patient Gender: F              HR:           96 bpm. Exam Location:  Inpatient Procedure: 2D Echo, Intracardiac Opacification Agent, Color Doppler and Cardiac            Doppler Indications:    V-Tach  History:        Patient has no prior history of Echocardiogram  examinations.                 Arrythmias:Tachycardia, Signs/Symptoms:Dyspnea; Risk                 Factors:Hypertension.  Sonographer:    Marella Chimes Referring Phys: [id number] [place]  1. Left ventricular ejection fraction, by estimation, is 55 to 60%. The left ventricle has normal function. The left ventricle has no regional wall motion abnormalities. Left ventricular diastolic parameters are consistent with Grade I diastolic dysfunction (impaired relaxation).  2. Right ventricular systolic function is normal. The right ventricular size is normal.  3. The mitral valve is normal in structure. No evidence of mitral valve regurgitation. No evidence of mitral stenosis.  4. The aortic valve is normal in structure. Aortic valve regurgitation is not visualized. No aortic stenosis is present.  5. The inferior vena cava is normal in size with greater than 50% respiratory variability, suggesting right atrial pressure of 3 mmHg.  6. Technically limited study due to poor sound wave transmission. FINDINGS  Left Ventricle: Left ventricular ejection fraction, by estimation, is 55 to 60%. The left ventricle has normal function. The left ventricle has no regional wall motion abnormalities. The left ventricular internal cavity size was normal in size. There is  no left ventricular hypertrophy. Left ventricular diastolic parameters are consistent with Grade I diastolic dysfunction (impaired relaxation). Right Ventricle: The right ventricular size is normal. No increase in right ventricular wall thickness. Right ventricular systolic function is normal. Left Atrium: Left atrial size was normal in size. Right Atrium: Right atrial size was normal in size. Pericardium: There is no evidence of pericardial effusion. Mitral Valve: The mitral valve is normal in structure. No evidence of mitral valve regurgitation. No evidence of mitral valve stenosis. Tricuspid Valve: The tricuspid valve is normal in structure. Tricuspid  valve regurgitation is trivial. No evidence of tricuspid stenosis. Aortic Valve: The aortic valve is normal in structure. Aortic valve regurgitation is not visualized. No aortic stenosis is present. Aortic valve mean gradient measures 4.0 mmHg. Aortic valve peak gradient measures 8.1 mmHg. Aortic valve area, by VTI measures 2.13 cm. Pulmonic Valve: The pulmonic valve was normal in structure. Pulmonic valve regurgitation is not visualized. No evidence of pulmonic stenosis. Aorta: The aortic root is normal in size and structure. Venous: The inferior vena cava is normal in size with greater than 50% respiratory variability, suggesting right atrial pressure of 3 mmHg. IAS/Shunts: No atrial level shunt detected by color flow Doppler.  LEFT VENTRICLE PLAX 2D LVIDd:         5.20 cm     Diastology LVIDs:         3.80 cm     LV e' medial:    8.92 cm/s LV PW:  1.00 cm     LV E/e' medial:  8.5 LV IVS:        0.90 cm     LV e' lateral:   7.62 cm/s LVOT diam:     1.90 cm     LV E/e' lateral: 9.9 LV SV:         57 LV SV Index:   30 LVOT Area:     2.84 cm  LV Volumes (MOD) LV vol d, MOD A4C: 76.3 ml LV vol s, MOD A4C: 40.7 ml LV SV MOD A4C:     76.3 ml RIGHT VENTRICLE RV S prime:     14.10 cm/s TAPSE (M-mode): 2.8 cm LEFT ATRIUM           Index        RIGHT ATRIUM           Index LA diam:      2.70 cm 1.43 cm/m   RA Area:     13.30 cm LA Vol (A4C): 52.1 ml 27.58 ml/m  RA Volume:   27.60 ml  14.61 ml/m  AORTIC VALVE AV Area (Vmax):    2.04 cm AV Area (Vmean):   2.23 cm AV Area (VTI):     2.13 cm AV Vmax:           142.00 cm/s AV Vmean:          91.100 cm/s AV VTI:            0.269 m AV Peak Grad:      8.1 mmHg AV Mean Grad:      4.0 mmHg LVOT Vmax:         102.00 cm/s LVOT Vmean:        71.700 cm/s LVOT VTI:          0.202 m LVOT/AV VTI ratio: 0.75  AORTA Ao Root diam: 2.60 cm Ao Asc diam:  3.00 cm MITRAL VALVE MV Area (PHT): 3.23 cm    SHUNTS MV Decel Time: 235 msec    Systemic VTI:  0.20 m MV E velocity: 75.40  cm/s  Systemic Diam: 1.90 cm MV A velocity: 96.40 cm/s MV E/A ratio:  0.78 Glori Bickers MD Electronically signed by Glori Bickers MD Signature Date/Time: 12/12/2022/2:55:51 PM    Final     Medications: Scheduled:  sodium chloride   Intravenous Once   cyanocobalamin  1,000 mcg Intramuscular Daily   guaiFENesin  1,200 mg Oral BID   polyethylene glycol-electrolytes  4,000 mL Oral Once   Continuous:  sodium chloride     cefTRIAXone (ROCEPHIN)  IV 2 g (12/12/22 1518)   lactated ringers 75 mL/hr at 12/12/22 1651   metronidazole      Assessment/Plan: 1) IDA. 2) Hematochezia. 3) Diarrhea.   Her daughter reports that she had her last colonoscopy with Dr. Michail Sermon last year in March.  She does not know the results.  Plan: 1) EGD/colon tomorrow.  LOS: 1 day   Johnel Yielding D 12/13/2022, 3:14 PM

## 2022-12-13 NOTE — Plan of Care (Signed)
  Problem: Education: Goal: Knowledge of General Education information will improve Description: Including pain rating scale, medication(s)/side effects and non-pharmacologic comfort measures Outcome: Progressing   Problem: Clinical Measurements: Goal: Diagnostic test results will improve Outcome: Progressing Goal: Respiratory complications will improve Outcome: Progressing Goal: Cardiovascular complication will be avoided Outcome: Progressing   

## 2022-12-13 NOTE — Progress Notes (Addendum)
PROGRESS NOTE    Tracey Blackburn  [id number] DOB: Jul 17, 1961 DOA: 12/10/2022 PCP: Patient, No Pcp Per   Brief Narrative: 62 year old-year-old with past medical history significant PMH  for diabetes and hypertension, who has been having upper respiratory symptoms for the last week, she was seen at urgent care and discharged home, presented with worsening shortness of breath, congestion.  She was also found to be tachycardic. CT a chest was negative for PE.  No acute cardiopulmonary disease.  Hepatic asteatosis. She received nebulizer and respiratory distress improved.  Patient also noted to have tachycardia, bloody diarrhea.     Assessment & Plan:   Principal Problem:   Tachycardia Active Problems:   Essential hypertension   Malaise and fatigue   Dyspnea   Thrombocytosis   Normocytic anemia   Leukocytosis  1-Acute Bronchitis; upper respiratory infection: Continue with Guaifenesin.  Xopenex PRN  Flutter Valve IV Ceftriaxone.  Improved.   2-Tachycardia;  Suspect related to dehydration, anemia.  Continue with  IV fluids.  TSH normal.  CT angio negative for PE>  Blood transfusion   3-Bloody Diarrhea;  Hb trending down from 9--7.8--7.4 Cycle hb.  GI Dr Man consulted. Patient will need colonoscopy in patient.  GI pathogen. C Diff, negative.  CT abdomen pelvis: Diffuse colonic wall thickening, most pronounced in the left colon. Findings compatible pancolitis, most likely infectious or inflammatory.   4-Pan-colitis;  Presents with diarrhea, bloody.  CT showed diffuse colonic wall thickening more pronounced left colon.  C diff: negative.  GI pathogen: pending.  IV ceftriaxone, Flagyl.    HTN; monitor. Might need to start metoprolol/   5-Thrombocytosis; reactive.   6-Iron deficiency anemia Acute blood loss anemia  In setting GI bleed.  Getting one unit PRBC today.   B 12 deficiency.  Started  B 12 supplement.   Hypokalemia; replaced.   Estimated body  mass index is 35.57 kg/m as calculated from the following:   Height as of this encounter: 5\' 2"  (1.575 m).   Weight as of this encounter: 88.2 kg.   DVT prophylaxis: SCD Code Status: Full code Family Communication: Crae discussed with daughter who was at bedside.  Disposition Plan:  Status is: Observation The patient remains OBS appropriate and will d/c before 2 midnights.    Consultants:  GI  Procedures:  None  Antimicrobials:    Subjective: She is feeling better from URI. Cough improved.  Still having bloody diarrhea. Denies abdominal pain.   Objective: Vitals:   12/13/22 0030 12/13/22 0339 12/13/22 0804 12/13/22 1406  BP: 130/65 120/67 119/67 (!) 142/62  Pulse: 95 93 (!) 110 (!) 110  Resp:  20 18 18   Temp: 98.3 F (36.8 C) 98.3 F (36.8 C) 98 F (36.7 C) 99.3 F (37.4 C)  TempSrc: Oral Oral Oral Oral  SpO2: 96% 98% 96% 100%  Weight:      Height:        Intake/Output Summary (Last 24 hours) at 12/13/2022 1503 Last data filed at 12/13/2022 [id number] Gross per 24 hour  Intake 1101.87 ml  Output 0 ml  Net 1101.87 ml    Filed Weights   12/10/22 2215  Weight: 88.2 kg    Examination:  General exam: NAD Respiratory system: CTA Cardiovascular system: S 1, S 2 RRR Gastrointestinal system: BS present, soft, nt Central nervous system: Alert, follows command Extremities: no edema    Data Reviewed: I have personally reviewed following labs and imaging studies  CBC: Recent Labs  Lab 12/10/22 2234 12/12/22  0423 12/12/22 1227 12/12/22 1857 12/13/22 0407 12/13/22 1054  WBC 12.3* 8.9 9.9  --   --   --   NEUTROABS 8.9*  --   --   --   --   --   HGB 9.5* 7.8* 9.3* 8.0* 7.6* 7.4*  HCT 30.8* 25.6* 31.5* 26.1* 25.5* 24.6*  MCV 74.0* 73.4* 76.1*  --   --   --   PLT 464* 398 332  --   --   --     Basic Metabolic Panel: Recent Labs  Lab 12/10/22 2234 12/11/22 0112 12/12/22 0423  NA 131*  --  138  K 3.2*  --  3.9  CL 101  --  104  CO2 22  --  25   GLUCOSE 136*  --  104*  BUN 7*  --  10  CREATININE 0.71  --  0.68  CALCIUM 8.0*  --  8.3*  MG  --  1.9  --     GFR: Estimated Creatinine Clearance: 76.1 mL/min (by C-G formula based on SCr of 0.68 mg/dL). Liver Function Tests: Recent Labs  Lab 12/10/22 2234 12/12/22 0423  AST 17 14*  ALT 10 11  ALKPHOS 56 43  BILITOT 0.7 0.4  PROT 8.0 6.7  ALBUMIN 2.8* 2.4*    No results for input(s): "LIPASE", "AMYLASE" in the last 168 hours. No results for input(s): "AMMONIA" in the last 168 hours. Coagulation Profile: No results for input(s): "INR", "PROTIME" in the last 168 hours. Cardiac Enzymes: No results for input(s): "CKTOTAL", "CKMB", "CKMBINDEX", "TROPONINI" in the last 168 hours. BNP (last 3 results) No results for input(s): "PROBNP" in the last 8760 hours. HbA1C: No results for input(s): "HGBA1C" in the last 72 hours. CBG: Recent Labs  Lab 12/11/22 2046  GLUCAP 176*    Lipid Profile: No results for input(s): "CHOL", "HDL", "LDLCALC", "TRIG", "CHOLHDL", "LDLDIRECT" in the last 72 hours. Thyroid Function Tests: Recent Labs    12/11/22 0240  TSH 0.425  FREET4 1.13*    Anemia Panel: Recent Labs    12/11/22 1200  VITAMINB12 231  FOLATE 11.2  FERRITIN 38  TIBC 211*  IRON 21*  RETICCTPCT 1.4    Sepsis Labs: Recent Labs  Lab 12/10/22 2332 12/11/22 0112  PROCALCITON  --  <0.10  LATICACIDVEN 1.5  --      Recent Results (from the past 240 hour(s))  Resp panel by RT-PCR (RSV, Flu A&B, Covid) Anterior Nasal Swab     Status: None   Collection Time: 12/05/22  7:08 PM   Specimen: Anterior Nasal Swab  Result Value Ref Range Status   SARS Coronavirus 2 by RT PCR NEGATIVE NEGATIVE Final    Comment: (NOTE) SARS-CoV-2 target nucleic acids are NOT DETECTED.  The SARS-CoV-2 RNA is generally detectable in upper respiratory specimens during the acute phase of infection. The lowest concentration of SARS-CoV-2 viral copies this assay can detect is 138 copies/mL.  A negative result does not preclude SARS-Cov-2 infection and should not be used as the sole basis for treatment or other patient management decisions. A negative result may occur with  improper specimen collection/handling, submission of specimen other than nasopharyngeal swab, presence of viral mutation(s) within the areas targeted by this assay, and inadequate number of viral copies(<138 copies/mL). A negative result must be combined with clinical observations, patient history, and epidemiological information. The expected result is Negative.  Fact Sheet for Patients:  EntrepreneurPulse.com.au  Fact Sheet for Healthcare Providers:  IncredibleEmployment.be  This test is no  t yet approved or cleared by the [place] and  has been authorized for detection and/or diagnosis of SARS-CoV-2 by FDA under an Emergency Use Authorization (EUA). This EUA will remain  in effect (meaning this test can be used) for the duration of the COVID-19 declaration under Section 564(b)(1) of the Act, 21 U.S.C.section 360bbb-3(b)(1), unless the authorization is terminated  or revoked sooner.       Influenza A by PCR NEGATIVE NEGATIVE Final   Influenza B by PCR NEGATIVE NEGATIVE Final    Comment: (NOTE) The Xpert Xpress SARS-CoV-2/FLU/RSV plus assay is intended as an aid in the diagnosis of influenza from Nasopharyngeal swab specimens and should not be used as a sole basis for treatment. Nasal washings and aspirates are unacceptable for Xpert Xpress SARS-CoV-2/FLU/RSV testing.  Fact Sheet for Patients: EntrepreneurPulse.com.au  Fact Sheet for Healthcare Providers: IncredibleEmployment.be  This test is not yet approved or cleared by the [place] FDA and has been authorized for detection and/or diagnosis of SARS-CoV-2 by FDA under an Emergency Use Authorization (EUA). This EUA will remain in effect (meaning this test  can be used) for the duration of the COVID-19 declaration under Section 564(b)(1) of the Act, 21 U.S.C. section 360bbb-3(b)(1), unless the authorization is terminated or revoked.     Resp Syncytial Virus by PCR NEGATIVE NEGATIVE Final    Comment: (NOTE) Fact Sheet for Patients: EntrepreneurPulse.com.au  Fact Sheet for Healthcare Providers: IncredibleEmployment.be  This test is not yet approved or cleared by the [place] FDA and has been authorized for detection and/or diagnosis of SARS-CoV-2 by FDA under an Emergency Use Authorization (EUA). This EUA will remain in effect (meaning this test can be used) for the duration of the COVID-19 declaration under Section 564(b)(1) of the Act, 21 U.S.C. section 360bbb-3(b)(1), unless the authorization is terminated or revoked.  Performed at [facility], [address] [place], [place] [address]   Resp panel by RT-PCR (RSV, Flu A&B, Covid) Anterior Nasal Swab     Status: None   Collection Time: 12/10/22 10:52 PM   Specimen: Anterior Nasal Swab  Result Value Ref Range Status   SARS Coronavirus 2 by RT PCR NEGATIVE NEGATIVE Final    Comment: (NOTE) SARS-CoV-2 target nucleic acids are NOT DETECTED.  The SARS-CoV-2 RNA is generally detectable in upper respiratory specimens during the acute phase of infection. The lowest concentration of SARS-CoV-2 viral copies this assay can detect is 138 copies/mL. A negative result does not preclude SARS-Cov-2 infection and should not be used as the sole basis for treatment or other patient management decisions. A negative result may occur with  improper specimen collection/handling, submission of specimen other than nasopharyngeal swab, presence of viral mutation(s) within the areas targeted by this assay, and inadequate number of viral copies(<138 copies/mL). A negative result must be combined with clinical observations, patient history, and  epidemiological information. The expected result is Negative.  Fact Sheet for Patients:  EntrepreneurPulse.com.au  Fact Sheet for Healthcare Providers:  IncredibleEmployment.be  This test is no t yet approved or cleared by the [place] FDA and  has been authorized for detection and/or diagnosis of SARS-CoV-2 by FDA under an Emergency Use Authorization (EUA). This EUA will remain  in effect (meaning this test can be used) for the duration of the COVID-19 declaration under Section 564(b)(1) of the Act, 21 U.S.C.section 360bbb-3(b)(1), unless the authorization is terminated  or revoked sooner.       Influenza A by PCR NEGATIVE NEGATIVE  Final   Influenza B by PCR NEGATIVE NEGATIVE Final    Comment: (NOTE) The Xpert Xpress SARS-CoV-2/FLU/RSV plus assay is intended as an aid in the diagnosis of influenza from Nasopharyngeal swab specimens and should not be used as a sole basis for treatment. Nasal washings and aspirates are unacceptable for Xpert Xpress SARS-CoV-2/FLU/RSV testing.  Fact Sheet for Patients: EntrepreneurPulse.com.au  Fact Sheet for Healthcare Providers: IncredibleEmployment.be  This test is not yet approved or cleared by the [place] FDA and has been authorized for detection and/or diagnosis of SARS-CoV-2 by FDA under an Emergency Use Authorization (EUA). This EUA will remain in effect (meaning this test can be used) for the duration of the COVID-19 declaration under Section 564(b)(1) of the Act, 21 U.S.C. section 360bbb-3(b)(1), unless the authorization is terminated or revoked.     Resp Syncytial Virus by PCR NEGATIVE NEGATIVE Final    Comment: (NOTE) Fact Sheet for Patients: EntrepreneurPulse.com.au  Fact Sheet for Healthcare Providers: IncredibleEmployment.be  This test is not yet approved or cleared by the [place] FDA and has been  authorized for detection and/or diagnosis of SARS-CoV-2 by FDA under an Emergency Use Authorization (EUA). This EUA will remain in effect (meaning this test can be used) for the duration of the COVID-19 declaration under Section 564(b)(1) of the Act, 21 U.S.C. section 360bbb-3(b)(1), unless the authorization is terminated or revoked.  Performed at [facility], [place] [address], [place], [place] [address]   Respiratory (~20 pathogens) panel by PCR     Status: None   Collection Time: 12/10/22 10:52 PM   Specimen: Nasopharyngeal Swab; Respiratory  Result Value Ref Range Status   Adenovirus NOT DETECTED NOT DETECTED Final   Coronavirus 229E NOT DETECTED NOT DETECTED Final    Comment: (NOTE) The Coronavirus on the Respiratory Panel, DOES NOT test for the novel  Coronavirus (2019 nCoV)    Coronavirus HKU1 NOT DETECTED NOT DETECTED Final   Coronavirus NL63 NOT DETECTED NOT DETECTED Final   Coronavirus OC43 NOT DETECTED NOT DETECTED Final   Metapneumovirus NOT DETECTED NOT DETECTED Final   Rhinovirus / Enterovirus NOT DETECTED NOT DETECTED Final   Influenza A NOT DETECTED NOT DETECTED Final   Influenza B NOT DETECTED NOT DETECTED Final   Parainfluenza Virus 1 NOT DETECTED NOT DETECTED Final   Parainfluenza Virus 2 NOT DETECTED NOT DETECTED Final   Parainfluenza Virus 3 NOT DETECTED NOT DETECTED Final   Parainfluenza Virus 4 NOT DETECTED NOT DETECTED Final   Respiratory Syncytial Virus NOT DETECTED NOT DETECTED Final   Bordetella pertussis NOT DETECTED NOT DETECTED Final   Bordetella Parapertussis NOT DETECTED NOT DETECTED Final   Chlamydophila pneumoniae NOT DETECTED NOT DETECTED Final   Mycoplasma pneumoniae NOT DETECTED NOT DETECTED Final    Comment: Performed at [facility], [place]. [address] [place], [place] [address]  Blood culture (routine x 2)     Status: None (Preliminary result)   Collection Time: 12/10/22 11:21 PM   Specimen: BLOOD  Result Value Ref  Range Status   Specimen Description   Final    BLOOD RIGHT ANTECUBITAL Performed at [facility] [address], [place], [place] [address]    Special Requests   Final    BOTTLES DRAWN AEROBIC AND ANAEROBIC Blood Culture adequate volume Performed at [facility] [address], [place], [place] [address]    Culture   Final    NO GROWTH 2 DAYS Performed at [facility], [address] [place], [place] [address]    Report Status PENDING  Incomplete  Blood culture (routine x 2)     Status: None (Preliminary result)   Collection Time: 12/10/22 11:32 PM   Specimen: BLOOD  Result Value Ref Range Status   Specimen Description   Final    BLOOD LEFT ANTECUBITAL Performed at [facility] [address], [place], [place] [address]    Special Requests   Final    BOTTLES DRAWN AEROBIC AND ANAEROBIC Blood Culture adequate volume Performed at [facility] [address], [place], [place] [address]    Culture   Final    NO GROWTH 2 DAYS Performed at [facility] [address], [place], [place] [address]    Report Status PENDING  Incomplete  Group A Strep by PCR     Status: None   Collection Time: 12/11/22  8:27 AM   Specimen: Throat; Sterile Swab  Result Value Ref Range Status   Group A Strep by PCR NOT DETECTED NOT DETECTED Final    Comment: Performed at [facility], [place] [address] [place], [place] [address]  C Difficile Quick Screen w PCR reflex     Status: None   Collection Time: 12/12/22 11:50 AM   Specimen: STOOL  Result Value Ref Range Status   C Diff antigen NEGATIVE NEGATIVE Final   C Diff toxin NEGATIVE NEGATIVE Final   C Diff interpretation No C. difficile detected.  Final    Comment: Performed at [facility], [place] [address], [place],  [address]         Radiology Studies: CT ABDOMEN PELVIS W CONTRAST  Result Date:  12/12/2022 CLINICAL DATA:  Bloody diarrhea EXAM: CT ABDOMEN AND PELVIS WITH CONTRAST TECHNIQUE: Multidetector CT imaging of the abdomen and pelvis was performed using the standard protocol following bolus administration of intravenous contrast. RADIATION DOSE REDUCTION: This exam was performed according to the departmental dose-optimization program which includes automated exposure control, adjustment of the mA and/or kV according to patient size and/or use of iterative reconstruction technique. CONTRAST:  121mL OMNIPAQUE IOHEXOL 300 MG/ML  SOLN COMPARISON:  10/26/2017 FINDINGS: Lower chest: No acute abnormality Hepatobiliary: Scattered cysts in the liver, the largest 2.7 cm centrally. Findings are unchanged since prior study. Gallbladder contracted, grossly unremarkable. Pancreas: No focal abnormality or ductal dilatation. Spleen: No focal abnormality.  Normal size. Adrenals/Urinary Tract: No adrenal abnormality. No focal renal abnormality. No stones or hydronephrosis. Urinary bladder is unremarkable. Stomach/Bowel: There is mild wall thickening throughout the entire colon compatible with colitis, best seen from the distal transverse colon through the sigmoid colon. Stomach and small bowel decompressed, unremarkable. Vascular/Lymphatic: No evidence of aneurysm or adenopathy. Reproductive: Uterus and adnexa unremarkable.  No mass. Other: No free fluid or free air. Musculoskeletal: No acute bony abnormality. IMPRESSION: Diffuse colonic wall thickening, most pronounced in the left colon. Findings compatible pancolitis, most likely infectious or inflammatory. Electronically Signed   By: Rolm Baptise M.D.   On: 12/12/2022 23:06   ECHOCARDIOGRAM COMPLETE  Result Date: 12/12/2022    ECHOCARDIOGRAM REPORT   Patient Name:   Tracey Blackburn Date of Exam: 12/12/2022 Medical Rec #:  [id number]      Height:       62.0 in Accession #:    [id number]     Weight:       194.5 lb Date of Birth:  [birth year]      BSA:  1.889 m  Patient Age:    28 years       BP:           118/71 mmHg Patient Gender: F              HR:           96 bpm. Exam Location:  Inpatient Procedure: 2D Echo, Intracardiac Opacification Agent, Color Doppler and Cardiac            Doppler Indications:    V-Tach  History:        Patient has no prior history of Echocardiogram examinations.                 Arrythmias:Tachycardia, Signs/Symptoms:Dyspnea; Risk                 Factors:Hypertension.  Sonographer:    Marella Chimes Referring Phys: [id number] [place]  1. Left ventricular ejection fraction, by estimation, is 55 to 60%. The left ventricle has normal function. The left ventricle has no regional wall motion abnormalities. Left ventricular diastolic parameters are consistent with Grade I diastolic dysfunction (impaired relaxation).  2. Right ventricular systolic function is normal. The right ventricular size is normal.  3. The mitral valve is normal in structure. No evidence of mitral valve regurgitation. No evidence of mitral stenosis.  4. The aortic valve is normal in structure. Aortic valve regurgitation is not visualized. No aortic stenosis is present.  5. The inferior vena cava is normal in size with greater than 50% respiratory variability, suggesting right atrial pressure of 3 mmHg.  6. Technically limited study due to poor sound wave transmission. FINDINGS  Left Ventricle: Left ventricular ejection fraction, by estimation, is 55 to 60%. The left ventricle has normal function. The left ventricle has no regional wall motion abnormalities. The left ventricular internal cavity size was normal in size. There is  no left ventricular hypertrophy. Left ventricular diastolic parameters are consistent with Grade I diastolic dysfunction (impaired relaxation). Right Ventricle: The right ventricular size is normal. No increase in right ventricular wall thickness. Right ventricular systolic function is normal. Left Atrium: Left atrial size was normal in  size. Right Atrium: Right atrial size was normal in size. Pericardium: There is no evidence of pericardial effusion. Mitral Valve: The mitral valve is normal in structure. No evidence of mitral valve regurgitation. No evidence of mitral valve stenosis. Tricuspid Valve: The tricuspid valve is normal in structure. Tricuspid valve regurgitation is trivial. No evidence of tricuspid stenosis. Aortic Valve: The aortic valve is normal in structure. Aortic valve regurgitation is not visualized. No aortic stenosis is present. Aortic valve mean gradient measures 4.0 mmHg. Aortic valve peak gradient measures 8.1 mmHg. Aortic valve area, by VTI measures 2.13 cm. Pulmonic Valve: The pulmonic valve was normal in structure. Pulmonic valve regurgitation is not visualized. No evidence of pulmonic stenosis. Aorta: The aortic root is normal in size and structure. Venous: The inferior vena cava is normal in size with greater than 50% respiratory variability, suggesting right atrial pressure of 3 mmHg. IAS/Shunts: No atrial level shunt detected by color flow Doppler.  LEFT VENTRICLE PLAX 2D LVIDd:         5.20 cm     Diastology LVIDs:         3.80 cm     LV e' medial:    8.92 cm/s LV PW:         1.00 cm     LV E/e' medial:  8.5  LV IVS:        0.90 cm     LV e' lateral:   7.62 cm/s LVOT diam:     1.90 cm     LV E/e' lateral: 9.9 LV SV:         57 LV SV Index:   30 LVOT Area:     2.84 cm  LV Volumes (MOD) LV vol d, MOD A4C: 76.3 ml LV vol s, MOD A4C: 40.7 ml LV SV MOD A4C:     76.3 ml RIGHT VENTRICLE RV S prime:     14.10 cm/s TAPSE (M-mode): 2.8 cm LEFT ATRIUM           Index        RIGHT ATRIUM           Index LA diam:      2.70 cm 1.43 cm/m   RA Area:     13.30 cm LA Vol (A4C): 52.1 ml 27.58 ml/m  RA Volume:   27.60 ml  14.61 ml/m  AORTIC VALVE AV Area (Vmax):    2.04 cm AV Area (Vmean):   2.23 cm AV Area (VTI):     2.13 cm AV Vmax:           142.00 cm/s AV Vmean:          91.100 cm/s AV VTI:            0.269 m AV Peak Grad:       8.1 mmHg AV Mean Grad:      4.0 mmHg LVOT Vmax:         102.00 cm/s LVOT Vmean:        71.700 cm/s LVOT VTI:          0.202 m LVOT/AV VTI ratio: 0.75  AORTA Ao Root diam: 2.60 cm Ao Asc diam:  3.00 cm MITRAL VALVE MV Area (PHT): 3.23 cm    SHUNTS MV Decel Time: 235 msec    Systemic VTI:  0.20 m MV E velocity: 75.40 cm/s  Systemic Diam: 1.90 cm MV A velocity: 96.40 cm/s MV E/A ratio:  0.78 Glori Bickers MD Electronically signed by Glori Bickers MD Signature Date/Time: 12/12/2022/2:55:51 PM    Final         Scheduled Meds:  sodium chloride   Intravenous Once   cyanocobalamin  1,000 mcg Intramuscular Daily   guaiFENesin  1,200 mg Oral BID   polyethylene glycol-electrolytes  4,000 mL Oral Once   Continuous Infusions:  sodium chloride     cefTRIAXone (ROCEPHIN)  IV 2 g (12/12/22 1518)   lactated ringers 75 mL/hr at 12/12/22 1651   metronidazole       LOS: 1 day    Time spent: 35 minutes.     Elmarie Shiley, MD [facility]   If 7PM-7AM, please contact night-coverage [email or web address]  12/13/2022, 3:03 PM

## 2022-12-13 NOTE — Progress Notes (Signed)
Subjective: No complaints.  Diarrhea is improving.  Objective: Vital signs in last 24 hours: Temp:  [98 F (36.7 C)-99.3 F (37.4 C)] 99.3 F (37.4 C) (03/21 1406) Pulse Rate:  [93-110] 110 (03/21 1406) Resp:  [18-27] 18 (03/21 1406) BP: (119-142)/(62-76) 142/62 (03/21 1406) SpO2:  [96 %-100 %] 100 % (03/21 1406) Last BM Date : 12/13/22  Intake/Output from previous day: 03/20 0701 - 03/21 0700 In: 1701.9 [P.O.:840; I.V.:761.9; IV Piggyback:100] Out: 0  Intake/Output this shift: No intake/output data recorded.  General appearance: alert and no distress GI: soft, non-tender; bowel sounds normal; no masses,  no organomegaly  Lab Results: Recent Labs    12/10/22 2234 12/12/22 0423 12/12/22 1227 12/12/22 1857 12/13/22 0407 12/13/22 1054  WBC 12.3* 8.9 9.9  --   --   --   HGB 9.5* 7.8* 9.3* 8.0* 7.6* 7.4*  HCT 30.8* 25.6* 31.5* 26.1* 25.5* 24.6*  PLT 464* 398 332  --   --   --    BMET Recent Labs    12/10/22 2234 12/12/22 0423  NA 131* 138  K 3.2* 3.9  CL 101 104  CO2 22 25  GLUCOSE 136* 104*  BUN 7* 10  CREATININE 0.71 0.68  CALCIUM 8.0* 8.3*   LFT Recent Labs    12/12/22 0423  PROT 6.7  ALBUMIN 2.4*  AST 14*  ALT 11  ALKPHOS 43  BILITOT 0.4   PT/INR No results for input(s): "LABPROT", "INR" in the last 72 hours. Hepatitis Panel No results for input(s): "HEPBSAG", "HCVAB", "HEPAIGM", "HEPBIGM" in the last 72 hours. C-Diff Recent Labs    12/12/22 1150  CDIFFTOX NEGATIVE   Fecal Lactopherrin No results for input(s): "FECLLACTOFRN" in the last 72 hours.  Studies/Results: CT ABDOMEN PELVIS W CONTRAST  Result Date: 12/12/2022 CLINICAL DATA:  Bloody diarrhea EXAM: CT ABDOMEN AND PELVIS WITH CONTRAST TECHNIQUE: Multidetector CT imaging of the abdomen and pelvis was performed using the standard protocol following bolus administration of intravenous contrast. RADIATION DOSE REDUCTION: This exam was performed according to the departmental  dose-optimization program which includes automated exposure control, adjustment of the mA and/or kV according to patient size and/or use of iterative reconstruction technique. CONTRAST:  158mL OMNIPAQUE IOHEXOL 300 MG/ML  SOLN COMPARISON:  10/26/2017 FINDINGS: Lower chest: No acute abnormality Hepatobiliary: Scattered cysts in the liver, the largest 2.7 cm centrally. Findings are unchanged since prior study. Gallbladder contracted, grossly unremarkable. Pancreas: No focal abnormality or ductal dilatation. Spleen: No focal abnormality.  Normal size. Adrenals/Urinary Tract: No adrenal abnormality. No focal renal abnormality. No stones or hydronephrosis. Urinary bladder is unremarkable. Stomach/Bowel: There is mild wall thickening throughout the entire colon compatible with colitis, best seen from the distal transverse colon through the sigmoid colon. Stomach and small bowel decompressed, unremarkable. Vascular/Lymphatic: No evidence of aneurysm or adenopathy. Reproductive: Uterus and adnexa unremarkable.  No mass. Other: No free fluid or free air. Musculoskeletal: No acute bony abnormality. IMPRESSION: Diffuse colonic wall thickening, most pronounced in the left colon. Findings compatible pancolitis, most likely infectious or inflammatory. Electronically Signed   By: Rolm Baptise M.D.   On: 12/12/2022 23:06   ECHOCARDIOGRAM COMPLETE  Result Date: 12/12/2022    ECHOCARDIOGRAM REPORT   Patient Name:   Tracey Blackburn Date of Exam: 12/12/2022 Medical Rec #:  [id number]      Height:       62.0 in Accession #:    [id number]     Weight:       194.5 lb Date of  Birth:  1960-10-25      BSA:          1.889 m Patient Age:    62 years       BP:           118/71 mmHg Patient Gender: F              HR:           96 bpm. Exam Location:  Inpatient Procedure: 2D Echo, Intracardiac Opacification Agent, Color Doppler and Cardiac            Doppler Indications:    V-Tach  History:        Patient has no prior history of Echocardiogram  examinations.                 Arrythmias:Tachycardia, Signs/Symptoms:Dyspnea; Risk                 Factors:Hypertension.  Sonographer:    Marella Chimes Referring Phys: [id number] [place]  1. Left ventricular ejection fraction, by estimation, is 55 to 60%. The left ventricle has normal function. The left ventricle has no regional wall motion abnormalities. Left ventricular diastolic parameters are consistent with Grade I diastolic dysfunction (impaired relaxation).  2. Right ventricular systolic function is normal. The right ventricular size is normal.  3. The mitral valve is normal in structure. No evidence of mitral valve regurgitation. No evidence of mitral stenosis.  4. The aortic valve is normal in structure. Aortic valve regurgitation is not visualized. No aortic stenosis is present.  5. The inferior vena cava is normal in size with greater than 50% respiratory variability, suggesting right atrial pressure of 3 mmHg.  6. Technically limited study due to poor sound wave transmission. FINDINGS  Left Ventricle: Left ventricular ejection fraction, by estimation, is 55 to 60%. The left ventricle has normal function. The left ventricle has no regional wall motion abnormalities. The left ventricular internal cavity size was normal in size. There is  no left ventricular hypertrophy. Left ventricular diastolic parameters are consistent with Grade I diastolic dysfunction (impaired relaxation). Right Ventricle: The right ventricular size is normal. No increase in right ventricular wall thickness. Right ventricular systolic function is normal. Left Atrium: Left atrial size was normal in size. Right Atrium: Right atrial size was normal in size. Pericardium: There is no evidence of pericardial effusion. Mitral Valve: The mitral valve is normal in structure. No evidence of mitral valve regurgitation. No evidence of mitral valve stenosis. Tricuspid Valve: The tricuspid valve is normal in structure. Tricuspid  valve regurgitation is trivial. No evidence of tricuspid stenosis. Aortic Valve: The aortic valve is normal in structure. Aortic valve regurgitation is not visualized. No aortic stenosis is present. Aortic valve mean gradient measures 4.0 mmHg. Aortic valve peak gradient measures 8.1 mmHg. Aortic valve area, by VTI measures 2.13 cm. Pulmonic Valve: The pulmonic valve was normal in structure. Pulmonic valve regurgitation is not visualized. No evidence of pulmonic stenosis. Aorta: The aortic root is normal in size and structure. Venous: The inferior vena cava is normal in size with greater than 50% respiratory variability, suggesting right atrial pressure of 3 mmHg. IAS/Shunts: No atrial level shunt detected by color flow Doppler.  LEFT VENTRICLE PLAX 2D LVIDd:         5.20 cm     Diastology LVIDs:         3.80 cm     LV e' medial:    8.92 cm/s LV PW:  1.00 cm     LV E/e' medial:  8.5 LV IVS:        0.90 cm     LV e' lateral:   7.62 cm/s LVOT diam:     1.90 cm     LV E/e' lateral: 9.9 LV SV:         57 LV SV Index:   30 LVOT Area:     2.84 cm  LV Volumes (MOD) LV vol d, MOD A4C: 76.3 ml LV vol s, MOD A4C: 40.7 ml LV SV MOD A4C:     76.3 ml RIGHT VENTRICLE RV S prime:     14.10 cm/s TAPSE (M-mode): 2.8 cm LEFT ATRIUM           Index        RIGHT ATRIUM           Index LA diam:      2.70 cm 1.43 cm/m   RA Area:     13.30 cm LA Vol (A4C): 52.1 ml 27.58 ml/m  RA Volume:   27.60 ml  14.61 ml/m  AORTIC VALVE AV Area (Vmax):    2.04 cm AV Area (Vmean):   2.23 cm AV Area (VTI):     2.13 cm AV Vmax:           142.00 cm/s AV Vmean:          91.100 cm/s AV VTI:            0.269 m AV Peak Grad:      8.1 mmHg AV Mean Grad:      4.0 mmHg LVOT Vmax:         102.00 cm/s LVOT Vmean:        71.700 cm/s LVOT VTI:          0.202 m LVOT/AV VTI ratio: 0.75  AORTA Ao Root diam: 2.60 cm Ao Asc diam:  3.00 cm MITRAL VALVE MV Area (PHT): 3.23 cm    SHUNTS MV Decel Time: 235 msec    Systemic VTI:  0.20 m MV E velocity: 75.40  cm/s  Systemic Diam: 1.90 cm MV A velocity: 96.40 cm/s MV E/A ratio:  0.78 Glori Bickers MD Electronically signed by Glori Bickers MD Signature Date/Time: 12/12/2022/2:55:51 PM    Final     Medications: Scheduled:  sodium chloride   Intravenous Once   cyanocobalamin  1,000 mcg Intramuscular Daily   guaiFENesin  1,200 mg Oral BID   polyethylene glycol-electrolytes  4,000 mL Oral Once   Continuous:  sodium chloride     cefTRIAXone (ROCEPHIN)  IV 2 g (12/12/22 1518)   lactated ringers 75 mL/hr at 12/12/22 1651   metronidazole      Assessment/Plan: 1) IDA. 2) Hematochezia. 3) Diarrhea.   Her daughter reports that she had her last colonoscopy with Dr. Michail Sermon last year in March.  She does not know the results.  Plan: 1) EGD/colon tomorrow.  LOS: 1 day   Adonica Fukushima D 12/13/2022, 3:14 PM

## 2022-12-14 ENCOUNTER — Encounter (HOSPITAL_COMMUNITY): Payer: Self-pay | Admitting: Internal Medicine

## 2022-12-14 ENCOUNTER — Inpatient Hospital Stay (HOSPITAL_COMMUNITY): Payer: BC Managed Care – PPO | Admitting: Anesthesiology

## 2022-12-14 ENCOUNTER — Encounter (HOSPITAL_COMMUNITY)
Admission: EM | Disposition: A | Discharge status: Home / Self Care | Payer: Self-pay | Source: Home / Self Care | Attending: Internal Medicine

## 2022-12-14 DIAGNOSIS — R000 Tachycardia, unspecified: Secondary | ICD-10-CM | POA: Diagnosis not present

## 2022-12-14 HISTORY — PX: ESOPHAGOGASTRODUODENOSCOPY: SHX5428

## 2022-12-14 HISTORY — PX: COLONOSCOPY: SHX5424

## 2022-12-14 HISTORY — PX: BIOPSY: SHX5522

## 2022-12-14 LAB — TYPE AND SCREEN
ABO/RH(D): A POS
Antibody Screen: NEGATIVE
Unit division: 0

## 2022-12-14 LAB — BPAM RBC
Blood Product Expiration Date: 202404132359
ISSUE DATE / TIME: 202403211525
Unit Type and Rh: 6200

## 2022-12-14 LAB — GASTROINTESTINAL PANEL BY PCR, STOOL (REPLACES STOOL CULTURE)

## 2022-12-14 LAB — BASIC METABOLIC PANEL
Anion gap: 8 (ref 5–15)
BUN: <5 mg/dL — ABNORMAL LOW (ref 8–23)
CO2: 25 mmol/L (ref 22–32)
Calcium: 8.1 mg/dL — ABNORMAL LOW (ref 8.9–10.3)
Chloride: 101 mmol/L (ref 98–111)
Creatinine, Ser: 0.69 mg/dL (ref 0.44–1.00)
GFR, Estimated: >60 mL/min (ref >60)
Glucose, Bld: 86 mg/dL (ref 70–99)
Potassium: 3.8 mmol/L (ref 3.5–5.1)
Sodium: 134 mmol/L — ABNORMAL LOW (ref 135–145)

## 2022-12-14 LAB — CBC
HCT: 29.4 % — ABNORMAL LOW (ref 36.0–46.0)
Hemoglobin: 9 g/dL — ABNORMAL LOW (ref 12.0–15.0)
MCH: 23.1 pg — ABNORMAL LOW (ref 26.0–34.0)
MCHC: 30.6 g/dL (ref 30.0–36.0)
MCV: 75.6 fL — ABNORMAL LOW (ref 80.0–100.0)
Platelets: 356 10*3/uL (ref 150–400)
RBC: 3.89 MIL/uL (ref 3.87–5.11)
RDW: 18 % — ABNORMAL HIGH (ref 11.5–15.5)
WBC: 11.4 10*3/uL — ABNORMAL HIGH (ref 4.0–10.5)
nRBC: 0 % (ref 0.0–0.2)

## 2022-12-14 LAB — HEMOGLOBIN AND HEMATOCRIT, BLOOD
HCT: 28.1 % — ABNORMAL LOW (ref 36.0–46.0)
Hemoglobin: 8.8 g/dL — ABNORMAL LOW (ref 12.0–15.0)

## 2022-12-14 LAB — MAGNESIUM: Magnesium: 1.6 mg/dL — ABNORMAL LOW (ref 1.7–2.4)

## 2022-12-14 SURGERY — ESOPHAGOGASTRODUODENOSCOPY (EGD) WITH PROPOFOL
Anesthesia: Monitor Anesthesia Care

## 2022-12-14 SURGERY — EGD (ESOPHAGOGASTRODUODENOSCOPY)
Anesthesia: General

## 2022-12-14 MED ORDER — LACTATED RINGERS IV SOLN: INTRAVENOUS | Status: DC | PRN

## 2022-12-14 MED ORDER — PROPOFOL 10 MG/ML IV BOLUS
INTRAVENOUS | Status: DC | PRN
  Administered 2022-12-14: 20 mg via INTRAVENOUS

## 2022-12-14 MED ORDER — IPRATROPIUM BROMIDE 0.02 % IN SOLN
0.5000 mg | Freq: Four times a day (QID) | RESPIRATORY_TRACT | Status: DC
  Administered 2022-12-14 – 2022-12-15 (×6): 0.5 mg via RESPIRATORY_TRACT
  Filled 2022-12-14 (×6): qty 2.5

## 2022-12-14 MED ORDER — BUDESONIDE 0.25 MG/2ML IN SUSP
0.2500 mg | Freq: Two times a day (BID) | RESPIRATORY_TRACT | Status: DC
  Administered 2022-12-14 – 2022-12-17 (×6): 0.25 mg via RESPIRATORY_TRACT
  Filled 2022-12-14 (×6): qty 2

## 2022-12-14 MED ORDER — METHYLPREDNISOLONE SODIUM SUCC 40 MG IJ SOLR: 40.0000 mg | Freq: Two times a day (BID) | INTRAMUSCULAR | Status: DC

## 2022-12-14 MED ORDER — METHYLPREDNISOLONE SODIUM SUCC 125 MG IJ SOLR
81.2500 mg | Freq: Every day | INTRAMUSCULAR | Status: DC
  Administered 2022-12-14 – 2022-12-16 (×3): 81.25 mg via INTRAVENOUS
  Filled 2022-12-14 (×3): qty 2

## 2022-12-14 MED ORDER — PROPOFOL 500 MG/50ML IV EMUL
INTRAVENOUS | Status: DC | PRN
  Administered 2022-12-14: 125 ug/kg/min via INTRAVENOUS

## 2022-12-14 MED ORDER — ARFORMOTEROL TARTRATE 15 MCG/2ML IN NEBU
15.0000 ug | INHALATION_SOLUTION | Freq: Two times a day (BID) | RESPIRATORY_TRACT | Status: DC
  Administered 2022-12-14 – 2022-12-17 (×6): 15 ug via RESPIRATORY_TRACT
  Filled 2022-12-14 (×6): qty 2

## 2022-12-14 MED ORDER — LIDOCAINE 2% (20 MG/ML) 5 ML SYRINGE
INTRAMUSCULAR | Status: DC | PRN
  Administered 2022-12-14: 40 mg via INTRAVENOUS

## 2022-12-14 MED ORDER — LACTATED RINGERS IV SOLN
INTRAVENOUS | Status: AC | PRN
  Administered 2022-12-14: 1000 mL via INTRAVENOUS

## 2022-12-14 MED ORDER — LEVALBUTEROL HCL 0.63 MG/3ML IN NEBU
0.6300 mg | INHALATION_SOLUTION | Freq: Four times a day (QID) | RESPIRATORY_TRACT | Status: DC
  Administered 2022-12-14 – 2022-12-15 (×6): 0.63 mg via RESPIRATORY_TRACT
  Filled 2022-12-14 (×6): qty 3

## 2022-12-14 NOTE — Progress Notes (Signed)
PROGRESS NOTE    Tracey Blackburn  [id number] DOB: 08/09/1961 DOA: 12/10/2022 PCP: Patient, No Pcp Per   Brief Narrative: 62 year old-year-old with past medical history significant PMH  for diabetes and hypertension, who has been having upper respiratory symptoms for the last week, she was seen at urgent care and discharged home, presented with worsening shortness of breath, congestion.  She was also found to be tachycardic. CT a chest was negative for PE.  No acute cardiopulmonary disease.  Hepatic asteatosis. She received nebulizer and respiratory distress improved.  Patient also noted to have tachycardia, bloody diarrhea.     Assessment & Plan:   Principal Problem:   Tachycardia Active Problems:   Essential hypertension   Malaise and fatigue   Dyspnea   Thrombocytosis   Normocytic anemia   Leukocytosis  1-Acute Bronchitis; upper respiratory infection: Continue with Guaifenesin.  Xopenex ipratropium schedule.  Flutter Valve IV Ceftriaxone.  She is more congested today, having wheezing.  Schedule nebulizer, pulmicort, brovana.  Started on IV solumedrol for UC.   2-Tachycardia;  Suspect related to dehydration, anemia.  TSH normal.  CT angio negative for PE>  Received Blood transfusion. HR improved.   3-Bloody Diarrhea;  GI Dr Man consulted. Patient will need colonoscopy in patient.  GI pathogen. C Diff, negative.  CT abdomen pelvis: Diffuse colonic wall thickening, most pronounced in the left colon. Findings compatible pancolitis, most likely infectious or inflammatory. Hb stable. Received one unit PRBC  4-Pan-colitis;  Presents with diarrhea, bloody.  CT showed diffuse colonic wall thickening more pronounced left colon.  C diff: negative.  GI pathogen: pending.  IV ceftriaxone, Flagyl.  Colonoscopy: Severe pan-colitis.  IV solumedrol.  Await biopsy   HTN; monitor. Might need to start metoprolol/   5-Thrombocytosis; reactive.   6-Iron deficiency  anemia Acute blood loss anemia  In setting GI bleed.  Received one unit PRBC Hb stable.   B 12 deficiency.  Started  B 12 supplement.   Hypokalemia; replaced.   Estimated body mass index is 35.57 kg/m as calculated from the following:   Height as of this encounter: 5\' 2"  (1.575 m).   Weight as of this encounter: 88.2 kg.   DVT prophylaxis: SCD Code Status: Full code Family Communication: Crae discussed with daughter who was at bedside.  Disposition Plan:  Status is: Observation The patient remains OBS appropriate and will d/c before 2 midnights.    Consultants:  GI  Procedures:  None  Antimicrobials:    Subjective: Her cough and dysphonia  is coming back  She is wheezing today/   Objective: Vitals:   12/14/22 1111 12/14/22 1115 12/14/22 1139 12/14/22 1324  BP: (!) 127/54  102/70 137/73  Pulse: 99 (!) 109 (!) 107 (!) 101  Resp: (!) 24 18 18  (!) 24  Temp:   98.3 F (36.8 C) 98 F (36.7 C)  TempSrc:   Oral Oral  SpO2: 98% 97% 98% 99%  Weight:      Height:        Intake/Output Summary (Last 24 hours) at 12/14/2022 1403 Last data filed at 12/14/2022 1057 Gross per 24 hour  Intake 2735.01 ml  Output 900 ml  Net 1835.01 ml    Filed Weights   12/10/22 2215  Weight: 88.2 kg    Examination:  General exam: NAD Respiratory system: BL wheezing Cardiovascular system: S 1, S2  RRR Gastrointestinal system: BS present, soft,  nt Central nervous system: Alert, follows command Extremities: No edema    Data  Reviewed: I have personally reviewed following labs and imaging studies  CBC: Recent Labs  Lab 12/10/22 2234 12/12/22 0423 12/12/22 1227 12/12/22 1857 12/13/22 0407 12/13/22 1054 12/13/22 1856 12/14/22 0259 12/14/22 0816  WBC 12.3* 8.9 9.9  --   --   --   --   --  11.4*  NEUTROABS 8.9*  --   --   --   --   --   --   --   --   HGB 9.5* 7.8* 9.3*   < > 7.6* 7.4* 8.8* 8.8* 9.0*  HCT 30.8* 25.6* 31.5*   < > 25.5* 24.6* 28.8* 28.1* 29.4*  MCV  74.0* 73.4* 76.1*  --   --   --   --   --  75.6*  PLT 464* 398 332  --   --   --   --   --  356   < > = values in this interval not displayed.    Basic Metabolic Panel: Recent Labs  Lab 12/10/22 2234 12/11/22 0112 12/12/22 0423 12/14/22 0816  NA 131*  --  138 134*  K 3.2*  --  3.9 3.8  CL 101  --  104 101  CO2 22  --  25 25  GLUCOSE 136*  --  104* 86  BUN 7*  --  10 <5*  CREATININE 0.71  --  0.68 0.69  CALCIUM 8.0*  --  8.3* 8.1*  MG  --  1.9  --  1.6*    GFR: Estimated Creatinine Clearance: 76.1 mL/min (by C-G formula based on SCr of 0.69 mg/dL). Liver Function Tests: Recent Labs  Lab 12/10/22 2234 12/12/22 0423  AST 17 14*  ALT 10 11  ALKPHOS 56 43  BILITOT 0.7 0.4  PROT 8.0 6.7  ALBUMIN 2.8* 2.4*    No results for input(s): "LIPASE", "AMYLASE" in the last 168 hours. No results for input(s): "AMMONIA" in the last 168 hours. Coagulation Profile: No results for input(s): "INR", "PROTIME" in the last 168 hours. Cardiac Enzymes: No results for input(s): "CKTOTAL", "CKMB", "CKMBINDEX", "TROPONINI" in the last 168 hours. BNP (last 3 results) No results for input(s): "PROBNP" in the last 8760 hours. HbA1C: No results for input(s): "HGBA1C" in the last 72 hours. CBG: Recent Labs  Lab 12/11/22 2046  GLUCAP 176*    Lipid Profile: No results for input(s): "CHOL", "HDL", "LDLCALC", "TRIG", "CHOLHDL", "LDLDIRECT" in the last 72 hours. Thyroid Function Tests: No results for input(s): "TSH", "T4TOTAL", "FREET4", "T3FREE", "THYROIDAB" in the last 72 hours.  Anemia Panel: No results for input(s): "VITAMINB12", "FOLATE", "FERRITIN", "TIBC", "IRON", "RETICCTPCT" in the last 72 hours.  Sepsis Labs: Recent Labs  Lab 12/10/22 2332 12/11/22 0112  PROCALCITON  --  <0.10  LATICACIDVEN 1.5  --      Recent Results (from the past 240 hour(s))  Resp panel by RT-PCR (RSV, Flu A&B, Covid) Anterior Nasal Swab     Status: None   Collection Time: 12/05/22  7:08 PM    Specimen: Anterior Nasal Swab  Result Value Ref Range Status   SARS Coronavirus 2 by RT PCR NEGATIVE NEGATIVE Final    Comment: (NOTE) SARS-CoV-2 target nucleic acids are NOT DETECTED.  The SARS-CoV-2 RNA is generally detectable in upper respiratory specimens during the acute phase of infection. The lowest concentration of SARS-CoV-2 viral copies this assay can detect is 138 copies/mL. A negative result does not preclude SARS-Cov-2 infection and should not be used as the sole basis for treatment or other patient  management decisions. A negative result may occur with  improper specimen collection/handling, submission of specimen other than nasopharyngeal swab, presence of viral mutation(s) within the areas targeted by this assay, and inadequate number of viral copies(<138 copies/mL). A negative result must be combined with clinical observations, patient history, and epidemiological information. The expected result is Negative.  Fact Sheet for Patients:  EntrepreneurPulse.com.au  Fact Sheet for Healthcare Providers:  IncredibleEmployment.be  This test is no t yet approved or cleared by the [place] FDA and  has been authorized for detection and/or diagnosis of SARS-CoV-2 by FDA under an Emergency Use Authorization (EUA). This EUA will remain  in effect (meaning this test can be used) for the duration of the COVID-19 declaration under Section 564(b)(1) of the Act, 21 U.S.C.section 360bbb-3(b)(1), unless the authorization is terminated  or revoked sooner.       Influenza A by PCR NEGATIVE NEGATIVE Final   Influenza B by PCR NEGATIVE NEGATIVE Final    Comment: (NOTE) The Xpert Xpress SARS-CoV-2/FLU/RSV plus assay is intended as an aid in the diagnosis of influenza from Nasopharyngeal swab specimens and should not be used as a sole basis for treatment. Nasal washings and aspirates are unacceptable for Xpert Xpress  SARS-CoV-2/FLU/RSV testing.  Fact Sheet for Patients: EntrepreneurPulse.com.au  Fact Sheet for Healthcare Providers: IncredibleEmployment.be  This test is not yet approved or cleared by the [place] FDA and has been authorized for detection and/or diagnosis of SARS-CoV-2 by FDA under an Emergency Use Authorization (EUA). This EUA will remain in effect (meaning this test can be used) for the duration of the COVID-19 declaration under Section 564(b)(1) of the Act, 21 U.S.C. section 360bbb-3(b)(1), unless the authorization is terminated or revoked.     Resp Syncytial Virus by PCR NEGATIVE NEGATIVE Final    Comment: (NOTE) Fact Sheet for Patients: EntrepreneurPulse.com.au  Fact Sheet for Healthcare Providers: IncredibleEmployment.be  This test is not yet approved or cleared by the [place] FDA and has been authorized for detection and/or diagnosis of SARS-CoV-2 by FDA under an Emergency Use Authorization (EUA). This EUA will remain in effect (meaning this test can be used) for the duration of the COVID-19 declaration under Section 564(b)(1) of the Act, 21 U.S.C. section 360bbb-3(b)(1), unless the authorization is terminated or revoked.  Performed at [facility], [address] [place], [place] [address]   Resp panel by RT-PCR (RSV, Flu A&B, Covid) Anterior Nasal Swab     Status: None   Collection Time: 12/10/22 10:52 PM   Specimen: Anterior Nasal Swab  Result Value Ref Range Status   SARS Coronavirus 2 by RT PCR NEGATIVE NEGATIVE Final    Comment: (NOTE) SARS-CoV-2 target nucleic acids are NOT DETECTED.  The SARS-CoV-2 RNA is generally detectable in upper respiratory specimens during the acute phase of infection. The lowest concentration of SARS-CoV-2 viral copies this assay can detect is 138 copies/mL. A negative result does not preclude SARS-Cov-2 infection and should not be  used as the sole basis for treatment or other patient management decisions. A negative result may occur with  improper specimen collection/handling, submission of specimen other than nasopharyngeal swab, presence of viral mutation(s) within the areas targeted by this assay, and inadequate number of viral copies(<138 copies/mL). A negative result must be combined with clinical observations, patient history, and epidemiological information. The expected result is Negative.  Fact Sheet for Patients:  EntrepreneurPulse.com.au  Fact Sheet for Healthcare Providers:  IncredibleEmployment.be  This test is no t yet approved  or cleared by the [place] and  has been authorized for detection and/or diagnosis of SARS-CoV-2 by FDA under an Emergency Use Authorization (EUA). This EUA will remain  in effect (meaning this test can be used) for the duration of the COVID-19 declaration under Section 564(b)(1) of the Act, 21 U.S.C.section 360bbb-3(b)(1), unless the authorization is terminated  or revoked sooner.       Influenza A by PCR NEGATIVE NEGATIVE Final   Influenza B by PCR NEGATIVE NEGATIVE Final    Comment: (NOTE) The Xpert Xpress SARS-CoV-2/FLU/RSV plus assay is intended as an aid in the diagnosis of influenza from Nasopharyngeal swab specimens and should not be used as a sole basis for treatment. Nasal washings and aspirates are unacceptable for Xpert Xpress SARS-CoV-2/FLU/RSV testing.  Fact Sheet for Patients: EntrepreneurPulse.com.au  Fact Sheet for Healthcare Providers: IncredibleEmployment.be  This test is not yet approved or cleared by the [place] FDA and has been authorized for detection and/or diagnosis of SARS-CoV-2 by FDA under an Emergency Use Authorization (EUA). This EUA will remain in effect (meaning this test can be used) for the duration of the COVID-19 declaration under Section  564(b)(1) of the Act, 21 U.S.C. section 360bbb-3(b)(1), unless the authorization is terminated or revoked.     Resp Syncytial Virus by PCR NEGATIVE NEGATIVE Final    Comment: (NOTE) Fact Sheet for Patients: EntrepreneurPulse.com.au  Fact Sheet for Healthcare Providers: IncredibleEmployment.be  This test is not yet approved or cleared by the [place] FDA and has been authorized for detection and/or diagnosis of SARS-CoV-2 by FDA under an Emergency Use Authorization (EUA). This EUA will remain in effect (meaning this test can be used) for the duration of the COVID-19 declaration under Section 564(b)(1) of the Act, 21 U.S.C. section 360bbb-3(b)(1), unless the authorization is terminated or revoked.  Performed at [facility], [place] [address], [place], [place] [address]   Respiratory (~20 pathogens) panel by PCR     Status: None   Collection Time: 12/10/22 10:52 PM   Specimen: Nasopharyngeal Swab; Respiratory  Result Value Ref Range Status   Adenovirus NOT DETECTED NOT DETECTED Final   Coronavirus 229E NOT DETECTED NOT DETECTED Final    Comment: (NOTE) The Coronavirus on the Respiratory Panel, DOES NOT test for the novel  Coronavirus (2019 nCoV)    Coronavirus HKU1 NOT DETECTED NOT DETECTED Final   Coronavirus NL63 NOT DETECTED NOT DETECTED Final   Coronavirus OC43 NOT DETECTED NOT DETECTED Final   Metapneumovirus NOT DETECTED NOT DETECTED Final   Rhinovirus / Enterovirus NOT DETECTED NOT DETECTED Final   Influenza A NOT DETECTED NOT DETECTED Final   Influenza B NOT DETECTED NOT DETECTED Final   Parainfluenza Virus 1 NOT DETECTED NOT DETECTED Final   Parainfluenza Virus 2 NOT DETECTED NOT DETECTED Final   Parainfluenza Virus 3 NOT DETECTED NOT DETECTED Final   Parainfluenza Virus 4 NOT DETECTED NOT DETECTED Final   Respiratory Syncytial Virus NOT DETECTED NOT DETECTED Final   Bordetella pertussis NOT DETECTED NOT  DETECTED Final   Bordetella Parapertussis NOT DETECTED NOT DETECTED Final   Chlamydophila pneumoniae NOT DETECTED NOT DETECTED Final   Mycoplasma pneumoniae NOT DETECTED NOT DETECTED Final    Comment: Performed at [facility], [place]. [address] [place], [place] [address]  Blood culture (routine x 2)     Status: None (Preliminary result)   Collection Time: 12/10/22 11:21 PM   Specimen: BLOOD  Result Value Ref Range Status   Specimen Description  Final    BLOOD RIGHT ANTECUBITAL Performed at [facility] [address] [place], [place] [address]    Special Requests   Final    BOTTLES DRAWN AEROBIC AND ANAEROBIC Blood Culture adequate volume Performed at [facility] [address], [place], [place] [address]    Culture   Final    NO GROWTH 3 DAYS Performed at [facility], [place] [address] [place], [place] [address]    Report Status PENDING  Incomplete  Blood culture (routine x 2)     Status: None (Preliminary result)   Collection Time: 12/10/22 11:32 PM   Specimen: BLOOD  Result Value Ref Range Status   Specimen Description   Final    BLOOD LEFT ANTECUBITAL Performed at [facility] [address], [place], [place] [address]    Special Requests   Final    BOTTLES DRAWN AEROBIC AND ANAEROBIC Blood Culture adequate volume Performed at [facility] [address] [place], [place] [address]    Culture   Final    NO GROWTH 3 DAYS Performed at [facility], [place] [address], [place], [place] [address]    Report Status PENDING  Incomplete  Group A Strep by PCR     Status: None   Collection Time: 12/11/22  8:27 AM   Specimen: Throat; Sterile Swab  Result Value Ref Range Status   Group A Strep by PCR NOT DETECTED NOT DETECTED Final    Comment: Performed at [facility], [place] [address] [place], [place] [address]  C Difficile Quick Screen w PCR reflex     Status: None    Collection Time: 12/12/22 11:50 AM   Specimen: STOOL  Result Value Ref Range Status   C Diff antigen NEGATIVE NEGATIVE Final   C Diff toxin NEGATIVE NEGATIVE Final   C Diff interpretation No C. difficile detected.  Final    Comment: Performed at [facility], [place] [address] [place], [place] [address]         Radiology Studies: CT ABDOMEN PELVIS W CONTRAST  Result Date: 12/12/2022 CLINICAL DATA:  Bloody diarrhea EXAM: CT ABDOMEN AND PELVIS WITH CONTRAST TECHNIQUE: Multidetector CT imaging of the abdomen and pelvis was performed using the standard protocol following bolus administration of intravenous contrast. RADIATION DOSE REDUCTION: This exam was performed according to the departmental dose-optimization program which includes automated exposure control, adjustment of the mA and/or kV according to patient size and/or use of iterative reconstruction technique. CONTRAST:  143mL OMNIPAQUE IOHEXOL 300 MG/ML  SOLN COMPARISON:  10/26/2017 FINDINGS: Lower chest: No acute abnormality Hepatobiliary: Scattered cysts in the liver, the largest 2.7 cm centrally. Findings are unchanged since prior study. Gallbladder contracted, grossly unremarkable. Pancreas: No focal abnormality or ductal dilatation. Spleen: No focal abnormality.  Normal size. Adrenals/Urinary Tract: No adrenal abnormality. No focal renal abnormality. No stones or hydronephrosis. Urinary bladder is unremarkable. Stomach/Bowel: There is mild wall thickening throughout the entire colon compatible with colitis, best seen from the distal transverse colon through the sigmoid colon. Stomach and small bowel decompressed, unremarkable. Vascular/Lymphatic: No evidence of aneurysm or adenopathy. Reproductive: Uterus and adnexa unremarkable.  No mass. Other: No free fluid or free air. Musculoskeletal: No acute bony abnormality. IMPRESSION: Diffuse colonic wall thickening, most pronounced in the left colon. Findings compatible  pancolitis, most likely infectious or inflammatory. Electronically Signed   By: Rolm Baptise M.D.   On: 12/12/2022 23:06  Scheduled Meds:  arformoterol  15 mcg Nebulization BID   budesonide (PULMICORT) nebulizer solution  0.25 mg Nebulization BID   cyanocobalamin  1,000 mcg Intramuscular Daily   guaiFENesin  1,200 mg Oral BID   ipratropium  0.5 mg Nebulization Q6H   levalbuterol  0.63 mg Nebulization Q6H   methylPREDNISolone (SOLU-MEDROL) injection  81.25 mg Intravenous Daily   Continuous Infusions:  cefTRIAXone (ROCEPHIN)  IV 2 g (12/13/22 2055)   metronidazole 500 mg (12/14/22 0751)     LOS: 2 days    Time spent: 35 minutes.     Elmarie Shiley, MD [facility]   If 7PM-7AM, please contact night-coverage [email or web address]  12/14/2022, 2:03 PM

## 2022-12-14 NOTE — Op Note (Signed)
[facility] Patient Name: Tracey Blackburn Procedure Date: 12/14/2022 MRN: [id number] Attending MD: Carol Ada , MD, [id number] Date of Birth: 1961/01/21 CSN: [id number] Age: 62 Admit Type: Inpatient Procedure:                Colonoscopy Indications:              Clinically significant diarrhea of unexplained                            origin, Hematochezia, Iron deficiency anemia Providers:                Carol Ada, MD, Fanny Skates RN, RN, William Dalton, Technician Referring MD:              Medicines:                 Complications:            No immediate complications. Estimated Blood Loss:     Estimated blood loss: none. Procedure:                Pre-Anesthesia Assessment:                           - Prior to the procedure, a History and Physical                            was performed, and patient medications and                            allergies were reviewed. The patient's tolerance of                            previous anesthesia was also reviewed. The risks                            and benefits of the procedure and the sedation                            options and risks were discussed with the patient.                            All questions were answered, and informed consent                            was obtained. Prior Anticoagulants: The patient has                            taken no anticoagulant or antiplatelet agents. ASA                            Grade Assessment: III - A patient with severe                            systemic disease.  After reviewing the risks and                            benefits, the patient was deemed in satisfactory                            condition to undergo the procedure.                           - Sedation was administered by an anesthesia                            professional. Deep sedation was attained.                           After obtaining informed consent, the  colonoscope                            was passed under direct vision. Throughout the                            procedure, the patient's blood pressure, pulse, and                            oxygen saturations were monitored continuously. The                            [id number] Olympus colonoscope was                            introduced through the anus and advanced to the the                            terminal ileum. The colonoscopy was performed                            without difficulty. The patient tolerated the                            procedure well. The quality of the bowel                            preparation was evaluated using the BBPS [facility]                            Bowel Preparation Scale) with scores of: Right                            Colon = 2 (minor amount of residual staining, small                            fragments of stool and/or opaque liquid, but mucosa  seen well), Transverse Colon = 2 (minor amount of                            residual staining, small fragments of stool and/or                            opaque liquid, but mucosa seen well) and Left Colon                            = 2 (minor amount of residual staining, small                            fragments of stool and/or opaque liquid, but mucosa                            seen well). The total BBPS score equals 6. The                            quality of the bowel preparation was good. The                            terminal ileum, ileocecal valve, appendiceal                            orifice, and rectum were photographed. Scope In: 10:37:36 AM Scope Out: 10:46:26 AM Scope Withdrawal Time: 0 hours 6 minutes 38 seconds  Total Procedure Duration: 0 hours 8 minutes 50 seconds  Findings:      Inflammation was found in a continuous and circumferential pattern from       the anus to the cecum. This was graded as Mayo Score 3 (severe, with        spontaneous bleeding, ulcerations). Biopsies were taken with a cold       forceps for histology.      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 Her prior colonoscopy       with Dr. Michail Sermon on 12/07/2021 was able to be reviewed as well as the       biopsies post procedure. There was no evidence of any masses. 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 Biopsied. Moderate Sedation:      Not Applicable - Patient had care per Anesthesia. Recommendation:           - Return patient to hospital ward for ongoing care.                           - Resume regular diet.                           - Continue present medications.                           - Await pathology results.                           -  Start Solumedrol 40 mg Q12 hours. Procedure Code(s):        --- Professional ---                           [phone], Colonoscopy, flexible; with biopsy, single                            or multiple Diagnosis Code(s):        --- Professional ---                           K51.00, Ulcerative (chronic) pancolitis without                            complications                           R19.7, Diarrhea, unspecified                           K92.1, Melena (includes Hematochezia)                           D50.9, Iron deficiency anemia, unspecified CPT copyright 2022 American Medical Association. All rights reserved. The codes documented in this report are preliminary and upon coder review may  be revised to meet current compliance requirements. Carol Ada, MD Carol Ada, MD 12/14/2022 10:59:57 AM This report has been signed electronically. Number of Addenda: 0

## 2022-12-14 NOTE — Interval H&P Note (Signed)
History and Physical Interval Note:  12/14/2022 10:03 AM  Tracey Blackburn  has presented today for surgery, with the diagnosis of anemia.  The various methods of treatment have been discussed with the patient and family. After consideration of risks, benefits and other options for treatment, the patient has consented to  Procedure(s): ESOPHAGOGASTRODUODENOSCOPY (EGD) (N/A) COLONOSCOPY (N/A) as a surgical intervention.  The patient's history has been reviewed, patient examined, no change in status, stable for surgery.  I have reviewed the patient's chart and labs.  Questions were answered to the patient's satisfaction.     [name] D

## 2022-12-14 NOTE — Transfer of Care (Signed)
Immediate Anesthesia Transfer of Care Note  Patient: Tracey Blackburn  Procedure(s) Performed: ESOPHAGOGASTRODUODENOSCOPY (EGD) COLONOSCOPY  Patient Location: PACU and Endoscopy Unit  Anesthesia Type:MAC  Level of Consciousness: awake  Airway & Oxygen Therapy: Patient Spontanous Breathing and Patient connected to face mask oxygen  Post-op Assessment: Report given to RN and Post -op Vital signs reviewed and stable  Post vital signs: Reviewed and stable  Last Vitals:  Vitals Value Taken Time  BP 136/49 12/14/22 1101  Temp 36.3 C 12/14/22 1101  Pulse 102 12/14/22 1102  Resp 30 12/14/22 1102  SpO2 97 % 12/14/22 1102  Vitals shown include unvalidated device data.  Last Pain:  Vitals:   12/14/22 1101  TempSrc: Temporal  PainSc:       Patients Stated Pain Goal: 0 ([id number])  Complications: No notable events documented.

## 2022-12-14 NOTE — Anesthesia Postprocedure Evaluation (Signed)
Anesthesia Post Note  Patient: Liisa Buckman  Procedure(s) Performed: ESOPHAGOGASTRODUODENOSCOPY (EGD) COLONOSCOPY     Patient location during evaluation: PACU Anesthesia Type: General Level of consciousness: awake and alert and oriented Pain management: pain level controlled Vital Signs Assessment: post-procedure vital signs reviewed and stable Respiratory status: spontaneous breathing, nonlabored ventilation and respiratory function stable Cardiovascular status: blood pressure returned to baseline and stable Postop Assessment: no apparent nausea or vomiting Anesthetic complications: no   No notable events documented.  Last Vitals:  Vitals:   12/14/22 1108 12/14/22 1110  BP: 114/69 (!) 127/50  Pulse: (!) 106 94  Resp: (!) 23 (!) 25  Temp:    SpO2: 97% 98%    Last Pain:  Vitals:   12/14/22 1105  TempSrc:   PainSc: 0-No pain                 Rashied Corallo A.

## 2022-12-14 NOTE — Anesthesia Procedure Notes (Signed)
Procedure Name: MAC Date/Time: 12/14/2022 10:15 AM  Performed by: Cynda Familia, CRNAPre-anesthesia Checklist: Patient identified, Emergency Drugs available, Suction available, Patient being monitored and Timeout performed Patient Re-evaluated:Patient Re-evaluated prior to induction Oxygen Delivery Method: Simple face mask Placement Confirmation: breath sounds checked- equal and bilateral and positive ETCO2 Dental Injury: Teeth and Oropharynx as per pre-operative assessment

## 2022-12-14 NOTE — Anesthesia Preprocedure Evaluation (Addendum)
Anesthesia Evaluation  Patient identified by MRN, date of birth, ID band Patient awake    Reviewed: Allergy & Precautions, NPO status , Patient's Chart, lab work & pertinent test results  Airway Mallampati: II       Dental  (+) Poor Dentition, Missing   Pulmonary shortness of breath and with exertion   Pulmonary exam normal breath sounds clear to auscultation       Cardiovascular hypertension, Normal cardiovascular exam Rhythm:Regular Rate:Normal     Neuro/Psych    GI/Hepatic negative GI ROS, Neg liver ROS,,,  Endo/Other  diabetes, Well Controlled  Obesity Hyperlipidemia  Renal/GU Renal diseaseHx/o renal calculi Renal cyst  negative genitourinary   Musculoskeletal  (+) Arthritis , Osteoarthritis,    Abdominal  (+) + obese  Peds  Hematology  (+) Blood dyscrasia, anemia   Anesthesia Other Findings   Reproductive/Obstetrics                             Anesthesia Physical Anesthesia Plan  ASA: 2  Anesthesia Plan: General   Post-op Pain Management: Minimal or no pain anticipated   Induction: Intravenous  PONV Risk Score and Plan: Propofol infusion and Treatment may vary due to age or medical condition  Airway Management Planned: Natural Airway, Simple Face Mask and Nasal Cannula  Additional Equipment: None  Intra-op Plan:   Post-operative Plan:   Informed Consent: I have reviewed the patients History and Physical, chart, labs and discussed the procedure including the risks, benefits and alternatives for the proposed anesthesia with the patient or authorized representative who has indicated his/her understanding and acceptance.     Dental advisory given  Plan Discussed with: Anesthesiologist and CRNA  Anesthesia Plan Comments:        Anesthesia Quick Evaluation

## 2022-12-14 NOTE — Progress Notes (Signed)
   12/14/22 1324  Assess: MEWS Score  Temp 98 F (36.7 C)  BP 137/73  MAP (mmHg) 91  Pulse Rate (!) 101  Resp (!) 24  SpO2 99 %  O2 Device Room Air  Assess: MEWS Score  MEWS Temp 0  MEWS Systolic 0  MEWS Pulse 1  MEWS RR 1  MEWS LOC 0  MEWS Score 2  MEWS Score Color Yellow  Assess: if the MEWS score is Yellow or Red  Were vital signs taken at a resting state? Yes  Focused Assessment Change from prior assessment (see assessment flowsheet)  Does the patient meet 2 or more of the SIRS criteria? Yes  Does the patient have a confirmed or suspected source of infection? Yes  Provider and Rapid Response Notified? Yes (Provider)  MEWS guidelines implemented  Yes, yellow  Treat  MEWS Interventions Considered administering scheduled or prn medications/treatments as ordered  Take Vital Signs  Increase Vital Sign Frequency  Yellow: Q2hr x1, continue Q4hrs until patient remains green for 12hrs  Escalate  MEWS: Escalate Yellow: Discuss with charge nurse and consider notifying provider and/or RRT  Notify: Charge Nurse/RN  Name of Charge Nurse/RN Notified Chancy Hurter, RN  Provider Notification  Provider Name/Title Dr. Tyrell Antonio  Date Provider Notified 12/14/22  Time Provider Notified 1359  Method of Notification Page  Notification Reason Other (Comment) (yellow mews)  Provider response See new orders  Date of Provider Response 12/14/22  Time of Provider Response 1401  Assess: SIRS CRITERIA  SIRS Temperature  0  SIRS Pulse 1  SIRS Respirations  1  SIRS WBC 0  SIRS Score Sum  2

## 2022-12-14 NOTE — Op Note (Signed)
[facility] Patient Name: Tracey Blackburn Procedure Date: 12/14/2022 MRN: [id number] Attending MD: Carol Ada , MD, [id number] Date of Birth: 01-18-1961 CSN: [id number] Age: 62 Admit Type: Inpatient Procedure:                Upper GI endoscopy Indications:              Iron deficiency anemia Providers:                Carol Ada, MD, Fanny Skates RN, RN, William Dalton, Technician Referring MD:              Medicines:                Propofol per Anesthesia Complications:            No immediate complications. Estimated Blood Loss:     Estimated blood loss: none. Procedure:                Pre-Anesthesia Assessment:                           - Prior to the procedure, a History and Physical                            was performed, and patient medications and                            allergies were reviewed. The patient's tolerance of                            previous anesthesia was also reviewed. The risks                            and benefits of the procedure and the sedation                            options and risks were discussed with the patient.                            All questions were answered, and informed consent                            was obtained. Prior Anticoagulants: The patient has                            taken no anticoagulant or antiplatelet agents. ASA                            Grade Assessment: III - A patient with severe                            systemic disease. After reviewing the risks and  benefits, the patient was deemed in satisfactory                            condition to undergo the procedure.                           - Sedation was administered by an anesthesia                            professional. Deep sedation was attained.                           After obtaining informed consent, the endoscope was                            passed under direct vision.  Throughout the                            procedure, the patient's blood pressure, pulse, and                            oxygen saturations were monitored continuously. The                            [id number] Olympus colonoscope was                            introduced through the mouth, and advanced to the                            second part of duodenum. The upper GI endoscopy was                            accomplished without difficulty. The patient                            tolerated the procedure well. Scope In: Scope Out: Findings:      The esophagus was normal.      The stomach was normal.      The examined duodenum was normal. Impression:               - Normal esophagus.                           - Normal stomach.                           - Normal examined duodenum.                           - No specimens collected. Moderate Sedation:      Not Applicable - Patient had care per Anesthesia. Recommendation:           - Proceed with the colonoscopy. Procedure Code(s):        --- Professional ---  [id number], Esophagogastroduodenoscopy, flexible,                            transoral; diagnostic, including collection of                            specimen(s) by brushing or washing, when performed                            (separate procedure) Diagnosis Code(s):        --- Professional ---                           D50.9, Iron deficiency anemia, unspecified CPT copyright 2022 [employer]. All rights reserved. The codes documented in this report are preliminary and upon coder review may  be revised to meet current compliance requirements. Carol Ada, MD Carol Ada, MD 12/14/2022 10:55:40 AM This report has been signed electronically. Number of Addenda: 0

## 2022-12-15 DIAGNOSIS — R000 Tachycardia, unspecified: Secondary | ICD-10-CM | POA: Diagnosis not present

## 2022-12-15 LAB — CBC
HCT: 28 % — ABNORMAL LOW (ref 36.0–46.0)
Hemoglobin: 8.5 g/dL — ABNORMAL LOW (ref 12.0–15.0)
MCH: 22.5 pg — ABNORMAL LOW (ref 26.0–34.0)
MCHC: 30.4 g/dL (ref 30.0–36.0)
MCV: 74.1 fL — ABNORMAL LOW (ref 80.0–100.0)
Platelets: 365 10*3/uL (ref 150–400)
RBC: 3.78 MIL/uL — ABNORMAL LOW (ref 3.87–5.11)
RDW: 18.1 % — ABNORMAL HIGH (ref 11.5–15.5)
WBC: 9.5 10*3/uL (ref 4.0–10.5)
nRBC: 0 % (ref 0.0–0.2)

## 2022-12-15 LAB — BASIC METABOLIC PANEL
Anion gap: 8 (ref 5–15)
BUN: 7 mg/dL — ABNORMAL LOW (ref 8–23)
CO2: 25 mmol/L (ref 22–32)
Calcium: 8.1 mg/dL — ABNORMAL LOW (ref 8.9–10.3)
Chloride: 103 mmol/L (ref 98–111)
Creatinine, Ser: 0.69 mg/dL (ref 0.44–1.00)
GFR, Estimated: >60 mL/min (ref >60)
Glucose, Bld: 114 mg/dL — ABNORMAL HIGH (ref 70–99)
Potassium: 3.6 mmol/L (ref 3.5–5.1)
Sodium: 136 mmol/L (ref 135–145)

## 2022-12-15 LAB — MAGNESIUM: Magnesium: 2 mg/dL (ref 1.7–2.4)

## 2022-12-15 MED ORDER — SODIUM CHLORIDE 0.9 % IV SOLN
2.0000 g | INTRAVENOUS | Status: AC
  Administered 2022-12-15 – 2022-12-16 (×2): 2 g via INTRAVENOUS
  Filled 2022-12-15 (×2): qty 20

## 2022-12-15 MED ORDER — METOPROLOL TARTRATE 25 MG PO TABS
25.0000 mg | ORAL_TABLET | Freq: Two times a day (BID) | ORAL | Status: DC
  Administered 2022-12-15 – 2022-12-17 (×4): 25 mg via ORAL
  Filled 2022-12-15 (×4): qty 1

## 2022-12-15 MED ORDER — METRONIDAZOLE 500 MG/100ML IV SOLN
500.0000 mg | Freq: Two times a day (BID) | INTRAVENOUS | Status: DC
  Administered 2022-12-15 – 2022-12-17 (×4): 500 mg via INTRAVENOUS
  Filled 2022-12-15 (×4): qty 100

## 2022-12-15 MED ORDER — IPRATROPIUM BROMIDE 0.02 % IN SOLN
0.5000 mg | Freq: Three times a day (TID) | RESPIRATORY_TRACT | Status: DC
  Administered 2022-12-16 – 2022-12-17 (×4): 0.5 mg via RESPIRATORY_TRACT
  Filled 2022-12-15 (×4): qty 2.5

## 2022-12-15 MED ORDER — LEVALBUTEROL HCL 0.63 MG/3ML IN NEBU
0.6300 mg | INHALATION_SOLUTION | Freq: Three times a day (TID) | RESPIRATORY_TRACT | Status: DC
  Administered 2022-12-16 – 2022-12-17 (×4): 0.63 mg via RESPIRATORY_TRACT
  Filled 2022-12-15 (×4): qty 3

## 2022-12-15 MED ORDER — METOPROLOL TARTRATE 25 MG PO TABS
12.5000 mg | ORAL_TABLET | Freq: Two times a day (BID) | ORAL | Status: DC
  Administered 2022-12-15: 12.5 mg via ORAL
  Filled 2022-12-15: qty 1

## 2022-12-15 NOTE — Progress Notes (Signed)
PROGRESS NOTE    [place]  [id number] DOB: 1960-11-29 DOA: 12/10/2022 PCP: Patient, No Pcp Per   Brief Narrative: 62 year old-year-old with past medical history significant PMH  for diabetes and hypertension, who has been having upper respiratory symptoms for the last week, she was seen at urgent care and discharged home, presented with worsening shortness of breath, congestion.  She was also found to be tachycardic. CT a chest was negative for PE.  No acute cardiopulmonary disease.  Hepatic asteatosis. She received nebulizer and respiratory distress improved.  Patient also noted to have tachycardia, bloody diarrhea.     Assessment & Plan:   Principal Problem:   Tachycardia Active Problems:   Essential hypertension   Malaise and fatigue   Dyspnea   Thrombocytosis   Normocytic anemia   Leukocytosis  1-Acute Bronchitis; upper respiratory infection: Continue with Guaifenesin.  Xopenex ipratropium schedule.  Flutter Valve IV Ceftriaxone.  Schedule nebulizer, pulmicort, brovana.  Started on IV solumedrol for UC.  Breathing improved today.   2-Tachycardia;  Suspect related to dehydration, anemia.  TSH normal.  CT angio negative for PE>  Received Blood transfusion. HR improved.  BP increasing, HR 110. Start low dose metoprolol.   3-Bloody Diarrhea;  -GI Dr Man consulted. -GI pathogen. C Diff, negative.  -CT abdomen pelvis: Diffuse colonic wall thickening, most pronounced in the left colon. Findings compatible pancolitis, most likely infectious or inflammatory. -Hb stable. Received one unit PRBC -Monitor hb.   4-Pan-colitis;  -Presents with diarrhea, bloody.  -CT showed diffuse colonic wall thickening more pronounced left colon.  -C diff: negative.  -GI pathogen: Negative.  -IV ceftriaxone, Flagyl.  -Colonoscopy: Severe pan-colitis.  -IV solumedrol for 24--48.  Await biopsy   HTN; Start metoprolol.   5-Thrombocytosis; reactive.   6-Iron deficiency  anemia Acute blood loss anemia  In setting GI bleed.  Received one unit PRBC Monitor hb   B 12 deficiency.  Started  B 12 supplement.   Hypokalemia; replaced.   Estimated body mass index is 35.57 kg/m as calculated from the following:   Height as of this encounter: 5\' 2"  (1.575 m).   Weight as of this encounter: 88.2 kg.   DVT prophylaxis: SCD Code Status: Full code Family Communication: Crae discussed with daughter who was at bedside.  Disposition Plan:  Status is: Observation The patient remains OBS appropriate and will d/c before 2 midnights.    Consultants:  GI  Procedures:  None  Antimicrobials:    Subjective: Breathing is better, had only two episode of diarrhea yesterday.  Denies abdominal pain.   Objective: Vitals:   12/15/22 0823 12/15/22 0919 12/15/22 1221 12/15/22 1433  BP:  (!) 163/72 (!) 161/88   Pulse:  (!) 116 (!) 115   Resp:  18 18   Temp:  98 F (36.7 C) 98.2 F (36.8 C)   TempSrc:  Oral Oral   SpO2: 98% 97% 97% 96%  Weight:      Height:        Intake/Output Summary (Last 24 hours) at 12/15/2022 1444 Last data filed at 12/15/2022 0600 Gross per 24 hour  Intake 270.69 ml  Output --  Net 270.69 ml    Filed Weights   12/10/22 2215  Weight: 88.2 kg    Examination:  General exam: NAD Respiratory system: Less wheezing, BL [place].  Cardiovascular system: S 1 S 2 RRR Gastrointestinal system: BS present, soft, nt Central nervous system: Alert, follows command  Extremities: No edema    Data  Reviewed: I have personally reviewed following labs and imaging studies  CBC: Recent Labs  Lab 12/10/22 2234 12/12/22 0423 12/12/22 1227 12/12/22 1857 12/13/22 1054 12/13/22 1856 12/14/22 0259 12/14/22 0816 12/15/22 0758  WBC 12.3* 8.9 9.9  --   --   --   --  11.4* 9.5  NEUTROABS 8.9*  --   --   --   --   --   --   --   --   HGB 9.5* 7.8* 9.3*   < > 7.4* 8.8* 8.8* 9.0* 8.5*  HCT 30.8* 25.6* 31.5*   < > 24.6* 28.8* 28.1* 29.4*  28.0*  MCV 74.0* 73.4* 76.1*  --   --   --   --  75.6* 74.1*  PLT 464* 398 332  --   --   --   --  356 365   < > = values in this interval not displayed.    Basic Metabolic Panel: Recent Labs  Lab 12/10/22 2234 12/11/22 0112 12/12/22 0423 12/14/22 0816 12/15/22 0758  NA 131*  --  138 134* 136  K 3.2*  --  3.9 3.8 3.6  CL 101  --  104 101 103  CO2 22  --  25 25 25   GLUCOSE 136*  --  104* 86 114*  BUN 7*  --  10 <5* 7*  CREATININE 0.71  --  0.68 0.69 0.69  CALCIUM 8.0*  --  8.3* 8.1* 8.1*  MG  --  1.9  --  1.6* 2.0    GFR: Estimated Creatinine Clearance: 76.1 mL/min (by C-G formula based on SCr of 0.69 mg/dL). Liver Function Tests: Recent Labs  Lab 12/10/22 2234 12/12/22 0423  AST 17 14*  ALT 10 11  ALKPHOS 56 43  BILITOT 0.7 0.4  PROT 8.0 6.7  ALBUMIN 2.8* 2.4*    No results for input(s): "LIPASE", "AMYLASE" in the last 168 hours. No results for input(s): "AMMONIA" in the last 168 hours. Coagulation Profile: No results for input(s): "INR", "PROTIME" in the last 168 hours. Cardiac Enzymes: No results for input(s): "CKTOTAL", "CKMB", "CKMBINDEX", "TROPONINI" in the last 168 hours. BNP (last 3 results) No results for input(s): "PROBNP" in the last 8760 hours. HbA1C: No results for input(s): "HGBA1C" in the last 72 hours. CBG: Recent Labs  Lab 12/11/22 2046  GLUCAP 176*    Lipid Profile: No results for input(s): "CHOL", "HDL", "LDLCALC", "TRIG", "CHOLHDL", "LDLDIRECT" in the last 72 hours. Thyroid Function Tests: No results for input(s): "TSH", "T4TOTAL", "FREET4", "T3FREE", "THYROIDAB" in the last 72 hours.  Anemia Panel: No results for input(s): "VITAMINB12", "FOLATE", "FERRITIN", "TIBC", "IRON", "RETICCTPCT" in the last 72 hours.  Sepsis Labs: Recent Labs  Lab 12/10/22 2332 12/11/22 0112  PROCALCITON  --  <0.10  LATICACIDVEN 1.5  --      Recent Results (from the past 240 hour(s))  Resp panel by RT-PCR (RSV, Flu A&B, Covid) Anterior Nasal Swab      Status: None   Collection Time: 12/05/22  7:08 PM   Specimen: Anterior Nasal Swab  Result Value Ref Range Status   SARS Coronavirus 2 by RT PCR NEGATIVE NEGATIVE Final    Comment: (NOTE) SARS-CoV-2 target nucleic acids are NOT DETECTED.  The SARS-CoV-2 RNA is generally detectable in upper respiratory specimens during the acute phase of infection. The lowest concentration of SARS-CoV-2 viral copies this assay can detect is 138 copies/mL. A negative result does not preclude SARS-Cov-2 infection and should not be used as the sole basis  for treatment or other patient management decisions. A negative result may occur with  improper specimen collection/handling, submission of specimen other than nasopharyngeal swab, presence of viral mutation(s) within the areas targeted by this assay, and inadequate number of viral copies(<138 copies/mL). A negative result must be combined with clinical observations, patient history, and epidemiological information. The expected result is Negative.  Fact Sheet for Patients:  EntrepreneurPulse.com.au  Fact Sheet for Healthcare Providers:  IncredibleEmployment.be  This test is no t yet approved or cleared by the [place] FDA and  has been authorized for detection and/or diagnosis of SARS-CoV-2 by FDA under an Emergency Use Authorization (EUA). This EUA will remain  in effect (meaning this test can be used) for the duration of the COVID-19 declaration under Section 564(b)(1) of the Act, 21 U.S.C.section 360bbb-3(b)(1), unless the authorization is terminated  or revoked sooner.       Influenza A by PCR NEGATIVE NEGATIVE Final   Influenza B by PCR NEGATIVE NEGATIVE Final    Comment: (NOTE) The Xpert Xpress SARS-CoV-2/FLU/RSV plus assay is intended as an aid in the diagnosis of influenza from Nasopharyngeal swab specimens and should not be used as a sole basis for treatment. Nasal washings and aspirates are  unacceptable for Xpert Xpress SARS-CoV-2/FLU/RSV testing.  Fact Sheet for Patients: EntrepreneurPulse.com.au  Fact Sheet for Healthcare Providers: IncredibleEmployment.be  This test is not yet approved or cleared by the [place] FDA and has been authorized for detection and/or diagnosis of SARS-CoV-2 by FDA under an Emergency Use Authorization (EUA). This EUA will remain in effect (meaning this test can be used) for the duration of the COVID-19 declaration under Section 564(b)(1) of the Act, 21 U.S.C. section 360bbb-3(b)(1), unless the authorization is terminated or revoked.     Resp Syncytial Virus by PCR NEGATIVE NEGATIVE Final    Comment: (NOTE) Fact Sheet for Patients: EntrepreneurPulse.com.au  Fact Sheet for Healthcare Providers: IncredibleEmployment.be  This test is not yet approved or cleared by the [place] FDA and has been authorized for detection and/or diagnosis of SARS-CoV-2 by FDA under an Emergency Use Authorization (EUA). This EUA will remain in effect (meaning this test can be used) for the duration of the COVID-19 declaration under Section 564(b)(1) of the Act, 21 U.S.C. section 360bbb-3(b)(1), unless the authorization is terminated or revoked.  Performed at [facility], [address] [place], [place] [address]   Resp panel by RT-PCR (RSV, Flu A&B, Covid) Anterior Nasal Swab     Status: None   Collection Time: 12/10/22 10:52 PM   Specimen: Anterior Nasal Swab  Result Value Ref Range Status   SARS Coronavirus 2 by RT PCR NEGATIVE NEGATIVE Final    Comment: (NOTE) SARS-CoV-2 target nucleic acids are NOT DETECTED.  The SARS-CoV-2 RNA is generally detectable in upper respiratory specimens during the acute phase of infection. The lowest concentration of SARS-CoV-2 viral copies this assay can detect is 138 copies/mL. A negative result does not preclude  SARS-Cov-2 infection and should not be used as the sole basis for treatment or other patient management decisions. A negative result may occur with  improper specimen collection/handling, submission of specimen other than nasopharyngeal swab, presence of viral mutation(s) within the areas targeted by this assay, and inadequate number of viral copies(<138 copies/mL). A negative result must be combined with clinical observations, patient history, and epidemiological information. The expected result is Negative.  Fact Sheet for Patients:  EntrepreneurPulse.com.au  Fact Sheet for Healthcare Providers:  IncredibleEmployment.be  This test  is no t yet approved or cleared by the [place] and  has been authorized for detection and/or diagnosis of SARS-CoV-2 by FDA under an Emergency Use Authorization (EUA). This EUA will remain  in effect (meaning this test can be used) for the duration of the COVID-19 declaration under Section 564(b)(1) of the Act, 21 U.S.C.section 360bbb-3(b)(1), unless the authorization is terminated  or revoked sooner.       Influenza A by PCR NEGATIVE NEGATIVE Final   Influenza B by PCR NEGATIVE NEGATIVE Final    Comment: (NOTE) The Xpert Xpress SARS-CoV-2/FLU/RSV plus assay is intended as an aid in the diagnosis of influenza from Nasopharyngeal swab specimens and should not be used as a sole basis for treatment. Nasal washings and aspirates are unacceptable for Xpert Xpress SARS-CoV-2/FLU/RSV testing.  Fact Sheet for Patients: EntrepreneurPulse.com.au  Fact Sheet for Healthcare Providers: IncredibleEmployment.be  This test is not yet approved or cleared by the [place] FDA and has been authorized for detection and/or diagnosis of SARS-CoV-2 by FDA under an Emergency Use Authorization (EUA). This EUA will remain in effect (meaning this test can be used) for the duration of  the COVID-19 declaration under Section 564(b)(1) of the Act, 21 U.S.C. section 360bbb-3(b)(1), unless the authorization is terminated or revoked.     Resp Syncytial Virus by PCR NEGATIVE NEGATIVE Final    Comment: (NOTE) Fact Sheet for Patients: EntrepreneurPulse.com.au  Fact Sheet for Healthcare Providers: IncredibleEmployment.be  This test is not yet approved or cleared by the [place] FDA and has been authorized for detection and/or diagnosis of SARS-CoV-2 by FDA under an Emergency Use Authorization (EUA). This EUA will remain in effect (meaning this test can be used) for the duration of the COVID-19 declaration under Section 564(b)(1) of the Act, 21 U.S.C. section 360bbb-3(b)(1), unless the authorization is terminated or revoked.  Performed at [facility], [place] [address] [place], [place] [address]   Respiratory (~20 pathogens) panel by PCR     Status: None   Collection Time: 12/10/22 10:52 PM   Specimen: Nasopharyngeal Swab; Respiratory  Result Value Ref Range Status   Adenovirus NOT DETECTED NOT DETECTED Final   Coronavirus 229E NOT DETECTED NOT DETECTED Final    Comment: (NOTE) The Coronavirus on the Respiratory Panel, DOES NOT test for the novel  Coronavirus (2019 nCoV)    Coronavirus HKU1 NOT DETECTED NOT DETECTED Final   Coronavirus NL63 NOT DETECTED NOT DETECTED Final   Coronavirus OC43 NOT DETECTED NOT DETECTED Final   Metapneumovirus NOT DETECTED NOT DETECTED Final   Rhinovirus / Enterovirus NOT DETECTED NOT DETECTED Final   Influenza A NOT DETECTED NOT DETECTED Final   Influenza B NOT DETECTED NOT DETECTED Final   Parainfluenza Virus 1 NOT DETECTED NOT DETECTED Final   Parainfluenza Virus 2 NOT DETECTED NOT DETECTED Final   Parainfluenza Virus 3 NOT DETECTED NOT DETECTED Final   Parainfluenza Virus 4 NOT DETECTED NOT DETECTED Final   Respiratory Syncytial Virus NOT DETECTED NOT DETECTED Final    Bordetella pertussis NOT DETECTED NOT DETECTED Final   Bordetella Parapertussis NOT DETECTED NOT DETECTED Final   Chlamydophila pneumoniae NOT DETECTED NOT DETECTED Final   Mycoplasma pneumoniae NOT DETECTED NOT DETECTED Final    Comment: Performed at [facility], [place]. [address], [place], [place] [address]  Blood culture (routine x 2)     Status: None (Preliminary result)   Collection Time: 12/10/22 11:21 PM   Specimen: BLOOD  Result Value Ref Range  Status   Specimen Description   Final    BLOOD RIGHT ANTECUBITAL Performed at [place] [address] [place], [place] [address]    Special Requests   Final    BOTTLES DRAWN AEROBIC AND ANAEROBIC Blood Culture adequate volume Performed at [place] [address], [place], [place] [address]    Culture   Final    NO GROWTH 4 DAYS Performed at [facility], [place] [address], [place], [place] [address]    Report Status PENDING  Incomplete  Blood culture (routine x 2)     Status: None (Preliminary result)   Collection Time: 12/10/22 11:32 PM   Specimen: BLOOD  Result Value Ref Range Status   Specimen Description   Final    BLOOD LEFT ANTECUBITAL Performed at [place] [address] [place], [place] [address]    Special Requests   Final    BOTTLES DRAWN AEROBIC AND ANAEROBIC Blood Culture adequate volume Performed at [place] [address] [place], [place] [address]    Culture   Final    NO GROWTH 4 DAYS Performed at [facility], [place] [address], [place], [place] [address]    Report Status PENDING  Incomplete  Group A Strep by PCR     Status: None   Collection Time: 12/11/22  8:27 AM   Specimen: Throat; Sterile Swab  Result Value Ref Range Status   Group A Strep by PCR NOT DETECTED NOT DETECTED Final    Comment: Performed at [facility], [place] [address], [place], [place] [id number]  Gastrointestinal  Panel by PCR , Stool     Status: None   Collection Time: 12/12/22 11:50 AM   Specimen: Stool  Result Value Ref Range Status   Campylobacter species NOT DETECTED NOT DETECTED Final   Plesimonas shigelloides NOT DETECTED NOT DETECTED Final   Salmonella species NOT DETECTED NOT DETECTED Final   Yersinia enterocolitica NOT DETECTED NOT DETECTED Final   Vibrio species NOT DETECTED NOT DETECTED Final   Vibrio cholerae NOT DETECTED NOT DETECTED Final   Enteroaggregative E coli (EAEC) NOT DETECTED NOT DETECTED Final   Enteropathogenic E coli (EPEC) NOT DETECTED NOT DETECTED Final   Enterotoxigenic E coli (ETEC) NOT DETECTED NOT DETECTED Final   Shiga like toxin producing E coli (STEC) NOT DETECTED NOT DETECTED Final   Shigella/Enteroinvasive E coli (EIEC) NOT DETECTED NOT DETECTED Final   Cryptosporidium NOT DETECTED NOT DETECTED Final   Cyclospora cayetanensis NOT DETECTED NOT DETECTED Final   Entamoeba histolytica NOT DETECTED NOT DETECTED Final   Giardia lamblia NOT DETECTED NOT DETECTED Final   Adenovirus F40/41 NOT DETECTED NOT DETECTED Final   Astrovirus NOT DETECTED NOT DETECTED Final   Norovirus GI/GII NOT DETECTED NOT DETECTED Final   Rotavirus A NOT DETECTED NOT DETECTED Final   Sapovirus (I, II, IV, and V) NOT DETECTED NOT DETECTED Final    Comment: Performed at [facility], [place] [place], [place] [address]  C Difficile Quick Screen w PCR reflex     Status: None   Collection Time: 12/12/22 11:50 AM   Specimen: STOOL  Result Value Ref Range Status   C Diff antigen NEGATIVE NEGATIVE Final   C Diff toxin NEGATIVE NEGATIVE Final   C Diff interpretation No C. difficile detected.  Final    Comment: Performed at [facility], [place] [address] [place],  [address]  Radiology Studies: No results found.      Scheduled Meds:  arformoterol  15 mcg Nebulization BID   budesonide (PULMICORT) nebulizer solution  0.25 mg  Nebulization BID   cyanocobalamin  1,000 mcg Intramuscular Daily   guaiFENesin  1,200 mg Oral BID   ipratropium  0.5 mg Nebulization Q6H   levalbuterol  0.63 mg Nebulization Q6H   methylPREDNISolone (SOLU-MEDROL) injection  81.25 mg Intravenous Daily   metoprolol tartrate  25 mg Oral BID   Continuous Infusions:  cefTRIAXone (ROCEPHIN)  IV     metronidazole       LOS: 3 days    Time spent: 35 minutes.     Elmarie Shiley, MD [facility]   If 7PM-7AM, please contact night-coverage [email or web address]  12/15/2022, 2:44 PM

## 2022-12-15 NOTE — Progress Notes (Signed)
   12/15/22 0919  Assess: MEWS Score  Temp 98 F (36.7 C)  BP (!) 163/72  MAP (mmHg) 96  Pulse Rate (!) 116  Resp 18  SpO2 97 %  O2 Device Room Air  Assess: MEWS Score  MEWS Temp 0  MEWS Systolic 0  MEWS Pulse 2  MEWS RR 0  MEWS LOC 0  MEWS Score 2  MEWS Score Color Yellow  Assess: if the MEWS score is Yellow or Red  Were vital signs taken at a resting state? Yes  Focused Assessment No change from prior assessment  Does the patient meet 2 or more of the SIRS criteria? No  Does the patient have a confirmed or suspected source of infection? Yes  Provider and Rapid Response Notified? No  MEWS guidelines implemented  No, previously yellow, continue vital signs every 4 hours  Assess: SIRS CRITERIA  SIRS Temperature  0  SIRS Pulse 1  SIRS Respirations  0  SIRS WBC 0  SIRS Score Sum  1

## 2022-12-15 NOTE — Progress Notes (Signed)
Subjective: No blood in stool. Less diarrhea. Less abdominal pain.  Objective: Vital signs in last 24 hours: Temp:  [98 F (36.7 C)-99 F (37.2 C)] 98 F (36.7 C) (03/23 0919) Pulse Rate:  [94-116] 116 (03/23 0919) Resp:  [18-24] 18 (03/23 0919) BP: (102-163)/(66-76) 163/72 (03/23 0919) SpO2:  [96 %-100 %] 97 % (03/23 0919) Weight change:  Last BM Date : 12/15/22  PE: GEN:  NAD, overweight, older-appearing than stated age ABD:  Soft, non-tender  Lab Results: CBC    Component Value Date/Time   WBC 9.5 12/15/2022 0758   RBC 3.78 (L) 12/15/2022 0758   HGB 8.5 (L) 12/15/2022 0758   HCT 28.0 (L) 12/15/2022 0758   PLT 365 12/15/2022 0758   MCV 74.1 (L) 12/15/2022 0758   MCH 22.5 (L) 12/15/2022 0758   MCHC 30.4 12/15/2022 0758   RDW 18.1 (H) 12/15/2022 0758   LYMPHSABS 1.9 12/10/2022 2234   MONOABS 1.1 (H) 12/10/2022 2234   EOSABS 0.3 12/10/2022 2234   BASOSABS 0.0 12/10/2022 2234  CMP     Component Value Date/Time   NA 136 12/15/2022 0758   K 3.6 12/15/2022 0758   CL 103 12/15/2022 0758   CO2 25 12/15/2022 0758   GLUCOSE 114 (H) 12/15/2022 0758   BUN 7 (L) 12/15/2022 0758   CREATININE 0.69 12/15/2022 0758   CALCIUM 8.1 (L) 12/15/2022 0758   PROT 6.7 12/12/2022 0423   ALBUMIN 2.4 (L) 12/12/2022 0423   AST 14 (L) 12/12/2022 0423   ALT 11 12/12/2022 0423   ALKPHOS 43 12/12/2022 0423   BILITOT 0.4 12/12/2022 0423   GFRNONAA >60 12/15/2022 0758   GFRAA >60 10/26/2017 1746    Assessment:   Bloody diarrhea. Pancolitis, consistent with ulcerative colitis, biopsies pending.  Plan:   Continue IV steroids another 24-48 hours, then consider taper to oral prednisone. On antibiotics, awaiting colonoscopy biopsies. Advance diet slowly as tolerated. GI team will follow.   Tracey [place] 12/15/2022, 11:24 AM   Cell [phone] If no answer or after 5 PM call [phone]

## 2022-12-16 DIAGNOSIS — R000 Tachycardia, unspecified: Secondary | ICD-10-CM | POA: Diagnosis not present

## 2022-12-16 LAB — CULTURE, BLOOD (ROUTINE X 2)
Culture: NO GROWTH
Culture: NO GROWTH
Special Requests: ADEQUATE
Special Requests: ADEQUATE

## 2022-12-16 LAB — BASIC METABOLIC PANEL
Anion gap: 8 (ref 5–15)
BUN: 6 mg/dL — ABNORMAL LOW (ref 8–23)
CO2: 24 mmol/L (ref 22–32)
Calcium: 8.2 mg/dL — ABNORMAL LOW (ref 8.9–10.3)
Chloride: 106 mmol/L (ref 98–111)
Creatinine, Ser: 0.64 mg/dL (ref 0.44–1.00)
GFR, Estimated: >60 mL/min (ref >60)
Glucose, Bld: 101 mg/dL — ABNORMAL HIGH (ref 70–99)
Potassium: 3.8 mmol/L (ref 3.5–5.1)
Sodium: 138 mmol/L (ref 135–145)

## 2022-12-16 LAB — CBC
HCT: 27.3 % — ABNORMAL LOW (ref 36.0–46.0)
Hemoglobin: 8.4 g/dL — ABNORMAL LOW (ref 12.0–15.0)
MCH: 23.2 pg — ABNORMAL LOW (ref 26.0–34.0)
MCHC: 30.8 g/dL (ref 30.0–36.0)
MCV: 75.4 fL — ABNORMAL LOW (ref 80.0–100.0)
Platelets: 354 10*3/uL (ref 150–400)
RBC: 3.62 MIL/uL — ABNORMAL LOW (ref 3.87–5.11)
RDW: 18.6 % — ABNORMAL HIGH (ref 11.5–15.5)
WBC: 9.4 10*3/uL (ref 4.0–10.5)
nRBC: 0 % (ref 0.0–0.2)

## 2022-12-16 MED ORDER — PREDNISONE 20 MG PO TABS
40.0000 mg | ORAL_TABLET | Freq: Every day | ORAL | Status: DC
  Administered 2022-12-17: 40 mg via ORAL
  Filled 2022-12-16: qty 2

## 2022-12-16 MED ORDER — SODIUM CHLORIDE 0.9 % IV SOLN
250.0000 mg | Freq: Once | INTRAVENOUS | Status: AC
  Administered 2022-12-16: 250 mg via INTRAVENOUS
  Filled 2022-12-16: qty 20

## 2022-12-16 NOTE — Progress Notes (Signed)
PROGRESS NOTE    Tracey Blackburn  [id number] DOB: 10/08/1960 DOA: 12/10/2022 PCP: Patient, No Pcp Per   Brief Narrative: 62 year old-year-old with past medical history significant PMH  for diabetes and hypertension, who has been having upper respiratory symptoms for the last week, she was seen at urgent care and discharged home, presented with worsening shortness of breath, congestion.  She was also found to be tachycardic. CT a chest was negative for PE.  No acute cardiopulmonary disease.  Hepatic asteatosis. She received nebulizer and respiratory distress improved.  Patient also noted to have tachycardia, bloody diarrhea.   Assessment & Plan:   Principal Problem:   Tachycardia Active Problems:   Essential hypertension   Malaise and fatigue   Dyspnea   Thrombocytosis   Normocytic anemia   Leukocytosis  1-Acute Bronchitis; upper respiratory infection: Continue with Guaifenesin.  Xopenex, ipratropium schedule.  Flutter Valve IV Ceftriaxone.  Schedule nebulizer, pulmicort, brovana.  Started on IV solumedrol for UC.  Improved.   2-Tachycardia;  Suspect related to dehydration, anemia.  TSH normal.  CT angio negative for PE>  Received Blood transfusion. HR improved.  BP increasing, HR 110. Started  low dose metoprolol.  Improved. On low dose metoprolol.   3-Bloody Diarrhea;  -GI Dr Man consulted. -GI pathogen. C Diff, negative.  -CT abdomen pelvis: Diffuse colonic wall thickening, most pronounced in the left colon. Findings compatible pancolitis, most likely infectious or inflammatory. -Hb stable. Received one unit PRBC Hb stable. No further bloody stool.   4-Pan-colitis;  -Presents with diarrhea, bloody.  -CT showed diffuse colonic wall thickening more pronounced left colon.  -C diff: negative.  -GI pathogen: Negative.  -IV ceftriaxone, Flagyl.  -Colonoscopy: Severe pan-colitis.  -IV solumedrol for 24--48.  Await biopsy Diarrhea improved.    HTN; Started  metoprolol.   5-Thrombocytosis; reactive.   6-Iron deficiency anemia Acute blood loss anemia  In setting GI bleed.  Received one unit PRBC Monitor hb   B 12 deficiency.  Started  B 12 supplement.   Hypokalemia; Replaced.   Estimated body mass index is 35.57 kg/m as calculated from the following:   Height as of this encounter: 5\' 2"  (1.575 m).   Weight as of this encounter: 88.2 kg.   DVT prophylaxis: SCD Code Status: Full code Family Communication: Crae discussed with daughter who was at bedside.  Disposition Plan:  Status is: Observation The patient remains OBS appropriate and will d/c before 2 midnights. Hopefully home tomorrow.     Consultants:  GI  Procedures:  None  Antimicrobials:    Subjective: She is breathing better. Diarrhea improved. Had 3 BM yesterday less bloody.    Objective: Vitals:   12/16/22 0619 12/16/22 1035 12/16/22 1420 12/16/22 1421  BP:  (!) 143/75  135/85  Pulse:  98 85 87  Resp:  (!) 22 20 18   Temp:  98.4 F (36.9 C)  98.1 F (36.7 C)  TempSrc:  Oral  Oral  SpO2: 97% 97% 100% 97%  Weight:      Height:        Intake/Output Summary (Last 24 hours) at 12/16/2022 1431 Last data filed at 12/16/2022 1300 Gross per 24 hour  Intake 680 ml  Output --  Net 680 ml    Filed Weights   12/10/22 2215  Weight: 88.2 kg    Examination:  General exam: NAD Respiratory system: BL ronchus , no wheezing Cardiovascular system: S 1, S 2 RRR Gastrointestinal system: BS present, soft, nt Central nervous system:  Alert, follows command Extremities: No edema    Data Reviewed: I have personally reviewed following labs and imaging studies  CBC: Recent Labs  Lab 12/10/22 2234 12/12/22 0423 12/12/22 1227 12/12/22 1857 12/13/22 1856 12/14/22 0259 12/14/22 0816 12/15/22 0758 12/16/22 0813  WBC 12.3* 8.9 9.9  --   --   --  11.4* 9.5 9.4  NEUTROABS 8.9*  --   --   --   --   --   --   --   --   HGB 9.5* 7.8* 9.3*   < > 8.8* 8.8* 9.0*  8.5* 8.4*  HCT 30.8* 25.6* 31.5*   < > 28.8* 28.1* 29.4* 28.0* 27.3*  MCV 74.0* 73.4* 76.1*  --   --   --  75.6* 74.1* 75.4*  PLT 464* 398 332  --   --   --  356 365 354   < > = values in this interval not displayed.    Basic Metabolic Panel: Recent Labs  Lab 12/10/22 2234 12/11/22 0112 12/12/22 0423 12/14/22 0816 12/15/22 0758 12/16/22 0813  NA 131*  --  138 134* 136 138  K 3.2*  --  3.9 3.8 3.6 3.8  CL 101  --  104 101 103 106  CO2 22  --  25 25 25 24   GLUCOSE 136*  --  104* 86 114* 101*  BUN 7*  --  10 <5* 7* 6*  CREATININE 0.71  --  0.68 0.69 0.69 0.64  CALCIUM 8.0*  --  8.3* 8.1* 8.1* 8.2*  MG  --  1.9  --  1.6* 2.0  --     GFR: Estimated Creatinine Clearance: 76.1 mL/min (by C-G formula based on SCr of 0.64 mg/dL). Liver Function Tests: Recent Labs  Lab 12/10/22 2234 12/12/22 0423  AST 17 14*  ALT 10 11  ALKPHOS 56 43  BILITOT 0.7 0.4  PROT 8.0 6.7  ALBUMIN 2.8* 2.4*    No results for input(s): "LIPASE", "AMYLASE" in the last 168 hours. No results for input(s): "AMMONIA" in the last 168 hours. Coagulation Profile: No results for input(s): "INR", "PROTIME" in the last 168 hours. Cardiac Enzymes: No results for input(s): "CKTOTAL", "CKMB", "CKMBINDEX", "TROPONINI" in the last 168 hours. BNP (last 3 results) No results for input(s): "PROBNP" in the last 8760 hours. HbA1C: No results for input(s): "HGBA1C" in the last 72 hours. CBG: Recent Labs  Lab 12/11/22 2046  GLUCAP 176*    Lipid Profile: No results for input(s): "CHOL", "HDL", "LDLCALC", "TRIG", "CHOLHDL", "LDLDIRECT" in the last 72 hours. Thyroid Function Tests: No results for input(s): "TSH", "T4TOTAL", "FREET4", "T3FREE", "THYROIDAB" in the last 72 hours.  Anemia Panel: No results for input(s): "VITAMINB12", "FOLATE", "FERRITIN", "TIBC", "IRON", "RETICCTPCT" in the last 72 hours.  Sepsis Labs: Recent Labs  Lab 12/10/22 2332 12/11/22 0112  PROCALCITON  --  <0.10  LATICACIDVEN 1.5  --       Recent Results (from the past 240 hour(s))  Resp panel by RT-PCR (RSV, Flu A&B, Covid) Anterior Nasal Swab     Status: None   Collection Time: 12/10/22 10:52 PM   Specimen: Anterior Nasal Swab  Result Value Ref Range Status   SARS Coronavirus 2 by RT PCR NEGATIVE NEGATIVE Final    Comment: (NOTE) SARS-CoV-2 target nucleic acids are NOT DETECTED.  The SARS-CoV-2 RNA is generally detectable in upper respiratory specimens during the acute phase of infection. The lowest concentration of SARS-CoV-2 viral copies this assay can detect is 138 copies/mL. A  negative result does not preclude SARS-Cov-2 infection and should not be used as the sole basis for treatment or other patient management decisions. A negative result may occur with  improper specimen collection/handling, submission of specimen other than nasopharyngeal swab, presence of viral mutation(s) within the areas targeted by this assay, and inadequate number of viral copies(<138 copies/mL). A negative result must be combined with clinical observations, patient history, and epidemiological information. The expected result is Negative.  Fact Sheet for Patients:  EntrepreneurPulse.com.au  Fact Sheet for Healthcare Providers:  IncredibleEmployment.be  This test is no t yet approved or cleared by the [place] FDA and  has been authorized for detection and/or diagnosis of SARS-CoV-2 by FDA under an Emergency Use Authorization (EUA). This EUA will remain  in effect (meaning this test can be used) for the duration of the COVID-19 declaration under Section 564(b)(1) of the Act, 21 U.S.C.section 360bbb-3(b)(1), unless the authorization is terminated  or revoked sooner.       Influenza A by PCR NEGATIVE NEGATIVE Final   Influenza B by PCR NEGATIVE NEGATIVE Final    Comment: (NOTE) The Xpert Xpress SARS-CoV-2/FLU/RSV plus assay is intended as an aid in the diagnosis of influenza from  Nasopharyngeal swab specimens and should not be used as a sole basis for treatment. Nasal washings and aspirates are unacceptable for Xpert Xpress SARS-CoV-2/FLU/RSV testing.  Fact Sheet for Patients: EntrepreneurPulse.com.au  Fact Sheet for Healthcare Providers: IncredibleEmployment.be  This test is not yet approved or cleared by the [place] FDA and has been authorized for detection and/or diagnosis of SARS-CoV-2 by FDA under an Emergency Use Authorization (EUA). This EUA will remain in effect (meaning this test can be used) for the duration of the COVID-19 declaration under Section 564(b)(1) of the Act, 21 U.S.C. section 360bbb-3(b)(1), unless the authorization is terminated or revoked.     Resp Syncytial Virus by PCR NEGATIVE NEGATIVE Final    Comment: (NOTE) Fact Sheet for Patients: EntrepreneurPulse.com.au  Fact Sheet for Healthcare Providers: IncredibleEmployment.be  This test is not yet approved or cleared by the [place] FDA and has been authorized for detection and/or diagnosis of SARS-CoV-2 by FDA under an Emergency Use Authorization (EUA). This EUA will remain in effect (meaning this test can be used) for the duration of the COVID-19 declaration under Section 564(b)(1) of the Act, 21 U.S.C. section 360bbb-3(b)(1), unless the authorization is terminated or revoked.  Performed at [facility], [place] [address] [place], [place] [address]   Respiratory (~20 pathogens) panel by PCR     Status: None   Collection Time: 12/10/22 10:52 PM   Specimen: Nasopharyngeal Swab; Respiratory  Result Value Ref Range Status   Adenovirus NOT DETECTED NOT DETECTED Final   Coronavirus 229E NOT DETECTED NOT DETECTED Final    Comment: (NOTE) The Coronavirus on the Respiratory Panel, DOES NOT test for the novel  Coronavirus (2019 nCoV)    Coronavirus HKU1 NOT DETECTED NOT DETECTED  Final   Coronavirus NL63 NOT DETECTED NOT DETECTED Final   Coronavirus OC43 NOT DETECTED NOT DETECTED Final   Metapneumovirus NOT DETECTED NOT DETECTED Final   Rhinovirus / Enterovirus NOT DETECTED NOT DETECTED Final   Influenza A NOT DETECTED NOT DETECTED Final   Influenza B NOT DETECTED NOT DETECTED Final   Parainfluenza Virus 1 NOT DETECTED NOT DETECTED Final   Parainfluenza Virus 2 NOT DETECTED NOT DETECTED Final   Parainfluenza Virus 3 NOT DETECTED NOT DETECTED Final   Parainfluenza Virus 4 NOT DETECTED NOT  DETECTED Final   Respiratory Syncytial Virus NOT DETECTED NOT DETECTED Final   Bordetella pertussis NOT DETECTED NOT DETECTED Final   Bordetella Parapertussis NOT DETECTED NOT DETECTED Final   Chlamydophila pneumoniae NOT DETECTED NOT DETECTED Final   Mycoplasma pneumoniae NOT DETECTED NOT DETECTED Final    Comment: Performed at [facility], [place] [address] [place], [place] [address]  Blood culture (routine x 2)     Status: None   Collection Time: 12/10/22 11:21 PM   Specimen: BLOOD  Result Value Ref Range Status   Specimen Description   Final    BLOOD RIGHT ANTECUBITAL Performed at [place] [address], [place], [place] [address]    Special Requests   Final    BOTTLES DRAWN AEROBIC AND ANAEROBIC Blood Culture adequate volume Performed at [place] [address], [place], [place] [address]    Culture   Final    NO GROWTH 5 DAYS Performed at [facility], [place] [address], [place], [place] [address]    Report Status 12/16/2022 FINAL  Final  Blood culture (routine x 2)     Status: None   Collection Time: 12/10/22 11:32 PM   Specimen: BLOOD  Result Value Ref Range Status   Specimen Description   Final    BLOOD LEFT ANTECUBITAL Performed at [place] [address] [place], [place] [address]    Special Requests   Final    BOTTLES DRAWN AEROBIC AND ANAEROBIC Blood Culture adequate  volume Performed at [place] [address], [place], [place] [address]    Culture   Final    NO GROWTH 5 DAYS Performed at [facility], [place] [address], [place], [place] [address]    Report Status 12/16/2022 FINAL  Final  Group A Strep by PCR     Status: None   Collection Time: 12/11/22  8:27 AM   Specimen: Throat; Sterile Swab  Result Value Ref Range Status   Group A Strep by PCR NOT DETECTED NOT DETECTED Final    Comment: Performed at [facility], [place] [address], [place],  [address]  Gastrointestinal Panel by PCR , Stool     Status: None   Collection Time: 12/12/22 11:50 AM   Specimen: Stool  Result Value Ref Range Status   Campylobacter species NOT DETECTED NOT DETECTED Final   Plesimonas shigelloides NOT DETECTED NOT DETECTED Final   Salmonella species NOT DETECTED NOT DETECTED Final   Yersinia enterocolitica NOT DETECTED NOT DETECTED Final   Vibrio species NOT DETECTED NOT DETECTED Final   Vibrio cholerae NOT DETECTED NOT DETECTED Final   Enteroaggregative E coli (EAEC) NOT DETECTED NOT DETECTED Final   Enteropathogenic E coli (EPEC) NOT DETECTED NOT DETECTED Final   Enterotoxigenic E coli (ETEC) NOT DETECTED NOT DETECTED Final   Shiga like toxin producing E coli (STEC) NOT DETECTED NOT DETECTED Final   Shigella/Enteroinvasive E coli (EIEC) NOT DETECTED NOT DETECTED Final   Cryptosporidium NOT DETECTED NOT DETECTED Final   Cyclospora cayetanensis NOT DETECTED NOT DETECTED Final   Entamoeba histolytica NOT DETECTED NOT DETECTED Final   Giardia lamblia NOT DETECTED NOT DETECTED Final   Adenovirus F40/41 NOT DETECTED NOT DETECTED Final   Astrovirus NOT DETECTED NOT DETECTED Final   Norovirus GI/GII NOT DETECTED NOT DETECTED Final   Rotavirus A NOT DETECTED NOT DETECTED Final   Sapovirus (I, II, IV, and V) NOT DETECTED NOT DETECTED Final  Comment: Performed at [facility], [place],  [place] [address]  C Difficile Quick Screen w PCR reflex     Status: None   Collection Time: 12/12/22 11:50 AM   Specimen: STOOL  Result Value Ref Range Status   C Diff antigen NEGATIVE NEGATIVE Final   C Diff toxin NEGATIVE NEGATIVE Final   C Diff interpretation No C. difficile detected.  Final    Comment: Performed at [facility], [place] [address] [place], [place] [address]         Radiology Studies: No results found.      Scheduled Meds:  arformoterol  15 mcg Nebulization BID   budesonide (PULMICORT) nebulizer solution  0.25 mg Nebulization BID   cyanocobalamin  1,000 mcg Intramuscular Daily   guaiFENesin  1,200 mg Oral BID   ipratropium  0.5 mg Nebulization TID   levalbuterol  0.63 mg Nebulization TID   metoprolol tartrate  25 mg Oral BID   [START ON 12/17/2022] predniSONE  40 mg Oral Q breakfast   Continuous Infusions:  cefTRIAXone (ROCEPHIN)  IV 2 g (12/15/22 2018)   metronidazole 500 mg (12/16/22 0807)     LOS: 4 days    Time spent: 35 minutes.     Elmarie Shiley, MD [facility]   If 7PM-7AM, please contact night-coverage [email or web address]  12/16/2022, 2:31 PM

## 2022-12-16 NOTE — Progress Notes (Signed)
Subjective: No further bleeding. Diarrhea much better. Minimal abdominal pain.  Objective: Vital signs in last 24 hours: Temp:  [97.9 F (36.6 C)-98.5 F (36.9 C)] 98.4 F (36.9 C) (03/24 1035) Pulse Rate:  [82-104] 98 (03/24 1035) Resp:  [22-24] 22 (03/24 1035) BP: (129-157)/(64-85) 143/75 (03/24 1035) SpO2:  [96 %-100 %] 97 % (03/24 1035) Weight change:  Last BM Date : 12/15/22  PE: GEN:  NAD, sitting upright eating breakfast NEURO:  A/O, no encephalopathy  Lab Results: CBC    Component Value Date/Time   WBC 9.4 12/16/2022 0813   RBC 3.62 (L) 12/16/2022 0813   HGB 8.4 (L) 12/16/2022 0813   HCT 27.3 (L) 12/16/2022 0813   PLT 354 12/16/2022 0813   MCV 75.4 (L) 12/16/2022 0813   MCH 23.2 (L) 12/16/2022 0813   MCHC 30.8 12/16/2022 0813   RDW 18.6 (H) 12/16/2022 0813   LYMPHSABS 1.9 12/10/2022 2234   MONOABS 1.1 (H) 12/10/2022 2234   EOSABS 0.3 12/10/2022 2234   BASOSABS 0.0 12/10/2022 2234  CMP     Component Value Date/Time   NA 138 12/16/2022 0813   K 3.8 12/16/2022 0813   CL 106 12/16/2022 0813   CO2 24 12/16/2022 0813   GLUCOSE 101 (H) 12/16/2022 0813   BUN 6 (L) 12/16/2022 0813   CREATININE 0.64 12/16/2022 0813   CALCIUM 8.2 (L) 12/16/2022 0813   PROT 6.7 12/12/2022 0423   ALBUMIN 2.4 (L) 12/12/2022 0423   AST 14 (L) 12/12/2022 0423   ALT 11 12/12/2022 0423   ALKPHOS 43 12/12/2022 0423   BILITOT 0.4 12/12/2022 0423   GFRNONAA >60 12/16/2022 0813   GFRAA >60 10/26/2017 1746   Assessment:  Bloody diarrhea. Pancolitis, consistent with ulcerative colitis, biopsies pending.  Plan:   Transition to oral prednisone starting tomorrow. Advance diet as tolerated. Anticipate discharge home tomorrow from GI perspective. Awaiting colonic biopsies. Dr. Benson Norway to see tomorrow.   Tracey Blackburn 12/16/2022, 12:38 PM   Cell [phone] If no answer or after 5 PM call [phone]

## 2022-12-16 NOTE — Plan of Care (Signed)

## 2022-12-17 DIAGNOSIS — R000 Tachycardia, unspecified: Secondary | ICD-10-CM | POA: Diagnosis not present

## 2022-12-17 LAB — CBC
HCT: 26.5 % — ABNORMAL LOW (ref 36.0–46.0)
Hemoglobin: 8.2 g/dL — ABNORMAL LOW (ref 12.0–15.0)
MCH: 23.1 pg — ABNORMAL LOW (ref 26.0–34.0)
MCHC: 30.9 g/dL (ref 30.0–36.0)
MCV: 74.6 fL — ABNORMAL LOW (ref 80.0–100.0)
Platelets: 334 10*3/uL (ref 150–400)
RBC: 3.55 MIL/uL — ABNORMAL LOW (ref 3.87–5.11)
RDW: 18.8 % — ABNORMAL HIGH (ref 11.5–15.5)
WBC: 8.7 10*3/uL (ref 4.0–10.5)
nRBC: 0.2 % (ref 0.0–0.2)

## 2022-12-17 LAB — BASIC METABOLIC PANEL
Anion gap: 7 (ref 5–15)
BUN: 7 mg/dL — ABNORMAL LOW (ref 8–23)
CO2: 25 mmol/L (ref 22–32)
Calcium: 8.3 mg/dL — ABNORMAL LOW (ref 8.9–10.3)
Chloride: 105 mmol/L (ref 98–111)
Creatinine, Ser: 0.64 mg/dL (ref 0.44–1.00)
GFR, Estimated: >60 mL/min (ref >60)
Glucose, Bld: 93 mg/dL (ref 70–99)
Potassium: 4 mmol/L (ref 3.5–5.1)
Sodium: 137 mmol/L (ref 135–145)

## 2022-12-17 LAB — SURGICAL PATHOLOGY

## 2022-12-17 MED ORDER — FERROUS SULFATE 325 (65 FE) MG PO TBEC: 325.0000 mg | DELAYED_RELEASE_TABLET | Freq: Every day | ORAL | 3 refills | Status: AC

## 2022-12-17 MED ORDER — BUDESONIDE-FORMOTEROL FUMARATE 80-4.5 MCG/ACT IN AERO: 2.0000 | INHALATION_SPRAY | Freq: Two times a day (BID) | RESPIRATORY_TRACT | 12 refills | Status: DC

## 2022-12-17 MED ORDER — LEVALBUTEROL HCL 0.63 MG/3ML IN NEBU: 0.6300 mg | INHALATION_SOLUTION | Freq: Two times a day (BID) | RESPIRATORY_TRACT | Status: DC

## 2022-12-17 MED ORDER — ALBUTEROL SULFATE HFA 108 (90 BASE) MCG/ACT IN AERS: 1.0000 | INHALATION_SPRAY | Freq: Four times a day (QID) | RESPIRATORY_TRACT | 0 refills | Status: DC | PRN

## 2022-12-17 MED ORDER — GUAIFENESIN ER 600 MG PO TB12: 1200.0000 mg | ORAL_TABLET | Freq: Two times a day (BID) | ORAL | 0 refills | Status: AC

## 2022-12-17 MED ORDER — METOPROLOL TARTRATE 25 MG PO TABS: 25.0000 mg | ORAL_TABLET | Freq: Two times a day (BID) | ORAL | 2 refills | Status: DC

## 2022-12-17 MED ORDER — CYANOCOBALAMIN 500 MCG PO TABS: 500.0000 ug | ORAL_TABLET | Freq: Every day | ORAL | 0 refills | Status: AC

## 2022-12-17 MED ORDER — IPRATROPIUM BROMIDE HFA 17 MCG/ACT IN AERS: 1.0000 | INHALATION_SPRAY | Freq: Three times a day (TID) | RESPIRATORY_TRACT | 2 refills | Status: DC

## 2022-12-17 MED ORDER — IPRATROPIUM BROMIDE 0.02 % IN SOLN: 0.5000 mg | Freq: Two times a day (BID) | RESPIRATORY_TRACT | Status: DC

## 2022-12-17 MED ORDER — PREDNISONE 20 MG PO TABS: 40.0000 mg | ORAL_TABLET | Freq: Every day | ORAL | 0 refills | Status: AC

## 2022-12-17 NOTE — Progress Notes (Signed)
AVS and discharge instructions reviewed w/ patient and daughter. Both verbalized understanding and had no further questions.

## 2022-12-17 NOTE — Progress Notes (Signed)
.   Transition of Care (TOC) Screening Note   Patient Details  Name: Tracey Blackburn Date of Birth: [birth year]   Transition of Care [facility]) CM/SW Contact:    Illene Regulus, LCSW Phone Number: 12/17/2022, 1:10 PM    Transition of Care Department [facility]) has reviewed patient and no TOC needs have been identified at this time. We will continue to monitor patient advancement through interdisciplinary progression rounds. If new patient transition needs arise, please place a TOC consult.

## 2022-12-17 NOTE — Plan of Care (Signed)
  Problem: Education: Goal: Knowledge of General Education information will improve Description: Including pain rating scale, medication(s)/side effects and non-pharmacologic comfort measures Outcome: Progressing   Problem: Elimination: Goal: Will not experience complications related to bowel motility Outcome: Progressing   Problem: Pain Managment: Goal: General experience of comfort will improve Outcome: Progressing   Problem: Activity: Goal: Risk for activity intolerance will decrease Outcome: Adequate for Discharge   Problem: Nutrition: Goal: Adequate nutrition will be maintained Outcome: Adequate for Discharge

## 2022-12-17 NOTE — Discharge Summary (Signed)
Physician Discharge Summary   Patient: Tracey Blackburn MRN: [id number] DOB: [birth year]  Admit date:     12/10/2022  Discharge date: 12/17/22  Discharge Physician: Elmarie Shiley   PCP: Patient, No Pcp Per   Recommendations at discharge:    Needs further taper dose of prednisone.  Follow up Biopsy results.  Follow up with Dr man for further care of colitis.  Needs CBC to follow Hb level.   Discharge Diagnoses: Principal Problem:   Tachycardia Active Problems:   Essential hypertension   Malaise and fatigue   Dyspnea   Thrombocytosis   Normocytic anemia   Leukocytosis  Resolved Problems:   * No resolved hospital problems. [facility] Course: 62 year old-year-old with past medical history significant PMH  for diabetes and hypertension, who has been having upper respiratory symptoms for the last week, she was seen at urgent care and discharged home, presented with worsening shortness of breath, congestion.  She was also found to be tachycardic. CT a chest was negative for PE.  No acute cardiopulmonary disease.  Hepatic asteatosis. She received nebulizer and respiratory distress improved.  Patient also noted to have tachycardia, bloody diarrhea.  Assessment and Plan: 1-Acute Bronchitis; upper respiratory infection: Continue with Guaifenesin.  Xopenex, ipratropium schedule.  Flutter Valve Received IV Ceftriaxone. Completed course.  Schedule nebulizer, pulmicort, brovana.  Started on IV solumedrol for UC.  Improved.  Discharge on albuterol, ipratropium and Symbicort.   2-Tachycardia;  Suspect related to dehydration, anemia.  TSH normal.  CT angio negative for PE>  Received Blood transfusion. HR improved.  BP increasing, HR 110. Started  low dose metoprolol.  Improved. On low dose metoprolol.    3-Bloody Diarrhea;  -GI Dr Man consulted. -GI pathogen. C Diff, negative.  -CT abdomen pelvis: Diffuse colonic wall thickening, most pronounced in the left colon. Findings  compatible pancolitis, most likely infectious or inflammatory. -Hb stable. Received one unit PRBC Hb stable. No further bloody stool.  Discharge on ferrous sulfate.  In setting of colitis.   4-Pan-colitis;  -Presents with diarrhea, bloody.  -CT showed diffuse colonic wall thickening more pronounced left colon.  -C diff: negative.  -GI pathogen: Negative.  -Received course IV ceftriaxone, Flagyl.  -Colonoscopy: Severe pan-colitis.  -Treated with IV solumedrol. Discharge on prednisone 40 mg daily. Discussed with Dr Benson Norway taper to be done out patient. Patient aware she needs follow up with Dr Collene Mares.  -Biopsy pending.  Diarrhea improved.      HTN; Started metoprolol.   improved.   5-Thrombocytosis; reactive.    6-Iron deficiency anemia Acute blood loss anemia  In setting GI bleed.  Received one unit PRBC Monitor hb  Discharge on  ferrous sulfate/    B 12 deficiency.  Started  B 12 supplement.    Hypokalemia; Replaced.  Obesity; needs life style modification.   Estimated body mass index is 35.57 kg/m as calculated from the following:   Height as of this encounter: 5\' 2"  (1.575 m).   Weight as of this encounter: 88.2 kg.        Consultants: GI Procedures performed: colonoscopy  Disposition: Home Diet recommendation:  Discharge Diet Orders (From admission, onward)     Start     Ordered   12/17/22 0000  Diet - low sodium heart healthy        12/17/22 1328           Cardiac diet DISCHARGE MEDICATION: Allergies as of 12/17/2022   No Known Allergies  Medication List     TAKE these medications    albuterol 108 (90 Base) MCG/ACT inhaler Commonly known as: VENTOLIN HFA Inhale 1-2 puffs into the lungs every 6 (six) hours as needed for wheezing or shortness of breath.   budesonide-formoterol 80-4.5 MCG/ACT inhaler Commonly known as: Symbicort Inhale 2 puffs into the lungs 2 (two) times daily.   cyanocobalamin 500 MCG tablet Commonly known as: VITAMIN  B12 Take 1 tablet (500 mcg total) by mouth daily for 20 days.   guaiFENesin 600 MG 12 hr tablet Commonly known as: MUCINEX Take 2 tablets (1,200 mg total) by mouth 2 (two) times daily.   ipratropium 17 MCG/ACT inhaler Commonly known as: ATROVENT HFA Inhale 1 puff into the lungs 3 (three) times daily.   metoprolol tartrate 25 MG tablet Commonly known as: LOPRESSOR Take 1 tablet (25 mg total) by mouth 2 (two) times daily.   predniSONE 20 MG tablet Commonly known as: DELTASONE Take 2 tablets (40 mg total) by mouth daily with breakfast. Start taking on: December 18, 2022        Follow-up Information     Go to  [facility] Emergency Department at [facility].   Specialty: Emergency Medicine Why: As needed, If symptoms worsen Contact information: [place] [id number] [place] [id number] [phone]               Discharge Exam: Filed Weights   12/10/22 2215  Weight: 88.2 kg   General NAD  Condition at discharge: stable  The results of significant diagnostics from this hospitalization (including imaging, microbiology, ancillary and laboratory) are listed below for reference.   Imaging Studies: CT ABDOMEN PELVIS W CONTRAST  Result Date: 12/12/2022 CLINICAL DATA:  Bloody diarrhea EXAM: CT ABDOMEN AND PELVIS WITH CONTRAST TECHNIQUE: Multidetector CT imaging of the abdomen and pelvis was performed using the standard protocol following bolus administration of intravenous contrast. RADIATION DOSE REDUCTION: This exam was performed according to the departmental dose-optimization program which includes automated exposure control, adjustment of the mA and/or kV according to patient size and/or use of iterative reconstruction technique. CONTRAST:  128mL OMNIPAQUE IOHEXOL 300 MG/ML  SOLN COMPARISON:  10/26/2017 FINDINGS: Lower chest: No acute abnormality Hepatobiliary: Scattered cysts in the liver, the largest 2.7 cm centrally. Findings are  unchanged since prior study. Gallbladder contracted, grossly unremarkable. Pancreas: No focal abnormality or ductal dilatation. Spleen: No focal abnormality.  Normal size. Adrenals/Urinary Tract: No adrenal abnormality. No focal renal abnormality. No stones or hydronephrosis. Urinary bladder is unremarkable. Stomach/Bowel: There is mild wall thickening throughout the entire colon compatible with colitis, best seen from the distal transverse colon through the sigmoid colon. Stomach and small bowel decompressed, unremarkable. Vascular/Lymphatic: No evidence of aneurysm or adenopathy. Reproductive: Uterus and adnexa unremarkable.  No mass. Other: No free fluid or free air. Musculoskeletal: No acute bony abnormality. IMPRESSION: Diffuse colonic wall thickening, most pronounced in the left colon. Findings compatible pancolitis, most likely infectious or inflammatory. Electronically Signed   By: Rolm Baptise M.D.   On: 12/12/2022 23:06   ECHOCARDIOGRAM COMPLETE  Result Date: 12/12/2022    ECHOCARDIOGRAM REPORT   Patient Name:   LEILI KAYLOR Date of Exam: 12/12/2022 Medical Rec #:  [id number]      Height:       62.0 in Accession #:    [id number]     Weight:       194.5 lb Date of Birth:  [birth year]      BSA:  1.889 m Patient Age:    89 years       BP:           118/71 mmHg Patient Gender: F              HR:           96 bpm. Exam Location:  Inpatient Procedure: 2D Echo, Intracardiac Opacification Agent, Color Doppler and Cardiac            Doppler Indications:    V-Tach  History:        Patient has no prior history of Echocardiogram examinations.                 Arrythmias:Tachycardia, Signs/Symptoms:Dyspnea; Risk                 Factors:Hypertension.  Sonographer:    Marella Chimes Referring Phys: [id number] [place]  1. Left ventricular ejection fraction, by estimation, is 55 to 60%. The left ventricle has normal function. The left ventricle has no regional wall motion abnormalities. Left  ventricular diastolic parameters are consistent with Grade I diastolic dysfunction (impaired relaxation).  2. Right ventricular systolic function is normal. The right ventricular size is normal.  3. The mitral valve is normal in structure. No evidence of mitral valve regurgitation. No evidence of mitral stenosis.  4. The aortic valve is normal in structure. Aortic valve regurgitation is not visualized. No aortic stenosis is present.  5. The inferior vena cava is normal in size with greater than 50% respiratory variability, suggesting right atrial pressure of 3 mmHg.  6. Technically limited study due to poor sound wave transmission. FINDINGS  Left Ventricle: Left ventricular ejection fraction, by estimation, is 55 to 60%. The left ventricle has normal function. The left ventricle has no regional wall motion abnormalities. The left ventricular internal cavity size was normal in size. There is  no left ventricular hypertrophy. Left ventricular diastolic parameters are consistent with Grade I diastolic dysfunction (impaired relaxation). Right Ventricle: The right ventricular size is normal. No increase in right ventricular wall thickness. Right ventricular systolic function is normal. Left Atrium: Left atrial size was normal in size. Right Atrium: Right atrial size was normal in size. Pericardium: There is no evidence of pericardial effusion. Mitral Valve: The mitral valve is normal in structure. No evidence of mitral valve regurgitation. No evidence of mitral valve stenosis. Tricuspid Valve: The tricuspid valve is normal in structure. Tricuspid valve regurgitation is trivial. No evidence of tricuspid stenosis. Aortic Valve: The aortic valve is normal in structure. Aortic valve regurgitation is not visualized. No aortic stenosis is present. Aortic valve mean gradient measures 4.0 mmHg. Aortic valve peak gradient measures 8.1 mmHg. Aortic valve area, by VTI measures 2.13 cm. Pulmonic Valve: The pulmonic valve was normal  in structure. Pulmonic valve regurgitation is not visualized. No evidence of pulmonic stenosis. Aorta: The aortic root is normal in size and structure. Venous: The inferior vena cava is normal in size with greater than 50% respiratory variability, suggesting right atrial pressure of 3 mmHg. IAS/Shunts: No atrial level shunt detected by color flow Doppler.  LEFT VENTRICLE PLAX 2D LVIDd:         5.20 cm     Diastology LVIDs:         3.80 cm     LV e' medial:    8.92 cm/s LV PW:         1.00 cm     LV E/e' medial:  8.5  LV IVS:        0.90 cm     LV e' lateral:   7.62 cm/s LVOT diam:     1.90 cm     LV E/e' lateral: 9.9 LV SV:         57 LV SV Index:   30 LVOT Area:     2.84 cm  LV Volumes (MOD) LV vol d, MOD A4C: 76.3 ml LV vol s, MOD A4C: 40.7 ml LV SV MOD A4C:     76.3 ml RIGHT VENTRICLE RV S prime:     14.10 cm/s TAPSE (M-mode): 2.8 cm LEFT ATRIUM           Index        RIGHT ATRIUM           Index LA diam:      2.70 cm 1.43 cm/m   RA Area:     13.30 cm LA Vol (A4C): 52.1 ml 27.58 ml/m  RA Volume:   27.60 ml  14.61 ml/m  AORTIC VALVE AV Area (Vmax):    2.04 cm AV Area (Vmean):   2.23 cm AV Area (VTI):     2.13 cm AV Vmax:           142.00 cm/s AV Vmean:          91.100 cm/s AV VTI:            0.269 m AV Peak Grad:      8.1 mmHg AV Mean Grad:      4.0 mmHg LVOT Vmax:         102.00 cm/s LVOT Vmean:        71.700 cm/s LVOT VTI:          0.202 m LVOT/AV VTI ratio: 0.75  AORTA Ao Root diam: 2.60 cm Ao Asc diam:  3.00 cm MITRAL VALVE MV Area (PHT): 3.23 cm    SHUNTS MV Decel Time: 235 msec    Systemic VTI:  0.20 m MV E velocity: 75.40 cm/s  Systemic Diam: 1.90 cm MV A velocity: 96.40 cm/s MV E/A ratio:  0.78 Glori Bickers MD Electronically signed by Glori Bickers MD Signature Date/Time: 12/12/2022/2:55:51 PM    Final    CT Angio Chest PE W and/or Wo Contrast  Result Date: 12/11/2022 CLINICAL DATA:  Pulmonary embolism (PE) suspected, high prob. Shortness of breath, cough EXAM: CT ANGIOGRAPHY CHEST WITH  CONTRAST TECHNIQUE: Multidetector CT imaging of the chest was performed using the standard protocol during bolus administration of intravenous contrast. Multiplanar CT image reconstructions and MIPs were obtained to evaluate the vascular anatomy. RADIATION DOSE REDUCTION: This exam was performed according to the departmental dose-optimization program which includes automated exposure control, adjustment of the mA and/or kV according to patient size and/or use of iterative reconstruction technique. CONTRAST:  37mL OMNIPAQUE IOHEXOL 350 MG/ML SOLN COMPARISON:  None Available. FINDINGS: Cardiovascular: No filling defects in the pulmonary arteries to suggest pulmonary emboli. Heart borderline in size. Aorta normal caliber. Mediastinum/Nodes: No mediastinal, hilar, or axillary adenopathy. Trachea and esophagus are unremarkable. Thyroid unremarkable. Lungs/Pleura: No confluent opacities or effusions. Upper Abdomen: Diffuse low-density throughout the liver compatible with fatty infiltration. Central low-density in the liver measures 2.6 cm, likely cyst. Musculoskeletal: Chest wall soft tissues are unremarkable. No acute bony abnormality. Review of the MIP images confirms the above findings. IMPRESSION: No evidence of pulmonary embolus. No acute cardiopulmonary disease. Hepatic steatosis Electronically Signed   By: Rolm Baptise M.D.   On: 12/11/2022 00:36  DG Chest 2 View  Result Date: 12/10/2022 CLINICAL DATA:  Cough and shortness of breath. EXAM: CHEST - 2 VIEW COMPARISON:  December 05, 2022 FINDINGS: The heart size and mediastinal contours are within normal limits. Mild areas of atelectasis and/or early infiltrate are seen within the bilateral lung bases, left greater than right. This represents a new finding when compared to the prior study. There is no evidence of a pleural effusion or pneumothorax. The visualized skeletal structures are unremarkable. IMPRESSION: Mild bibasilar atelectasis and/or early infiltrate,  left greater than right. Electronically Signed   By: Virgina Norfolk M.D.   On: 12/10/2022 22:56   DG Chest 2 View  Result Date: 12/05/2022 CLINICAL DATA:  Cough and fever. EXAM: CHEST - 2 VIEW COMPARISON:  None Available. FINDINGS: The lungs are clear without focal pneumonia, edema, pneumothorax or pleural effusion. The cardiopericardial silhouette is within normal limits for size. The visualized bony structures of the thorax are unremarkable. IMPRESSION: No active cardiopulmonary disease. Electronically Signed   By: Misty Stanley M.D.   On: 12/05/2022 21:25    Microbiology: Results for orders placed or performed during the hospital encounter of 12/10/22  Resp panel by RT-PCR (RSV, Flu A&B, Covid) Anterior Nasal Swab     Status: None   Collection Time: 12/10/22 10:52 PM   Specimen: Anterior Nasal Swab  Result Value Ref Range Status   SARS Coronavirus 2 by RT PCR NEGATIVE NEGATIVE Final    Comment: (NOTE) SARS-CoV-2 target nucleic acids are NOT DETECTED.  The SARS-CoV-2 RNA is generally detectable in upper respiratory specimens during the acute phase of infection. The lowest concentration of SARS-CoV-2 viral copies this assay can detect is 138 copies/mL. A negative result does not preclude SARS-Cov-2 infection and should not be used as the sole basis for treatment or other patient management decisions. A negative result may occur with  improper specimen collection/handling, submission of specimen other than nasopharyngeal swab, presence of viral mutation(s) within the areas targeted by this assay, and inadequate number of viral copies(<138 copies/mL). A negative result must be combined with clinical observations, patient history, and epidemiological information. The expected result is Negative.  Fact Sheet for Patients:  EntrepreneurPulse.com.au  Fact Sheet for Healthcare Providers:  IncredibleEmployment.be  This test is no t yet approved or  cleared by the [place] FDA and  has been authorized for detection and/or diagnosis of SARS-CoV-2 by FDA under an Emergency Use Authorization (EUA). This EUA will remain  in effect (meaning this test can be used) for the duration of the COVID-19 declaration under Section 564(b)(1) of the Act, 21 U.S.C.section 360bbb-3(b)(1), unless the authorization is terminated  or revoked sooner.       Influenza A by PCR NEGATIVE NEGATIVE Final   Influenza B by PCR NEGATIVE NEGATIVE Final    Comment: (NOTE) The Xpert Xpress SARS-CoV-2/FLU/RSV plus assay is intended as an aid in the diagnosis of influenza from Nasopharyngeal swab specimens and should not be used as a sole basis for treatment. Nasal washings and aspirates are unacceptable for Xpert Xpress SARS-CoV-2/FLU/RSV testing.  Fact Sheet for Patients: EntrepreneurPulse.com.au  Fact Sheet for Healthcare Providers: IncredibleEmployment.be  This test is not yet approved or cleared by the [place] FDA and has been authorized for detection and/or diagnosis of SARS-CoV-2 by FDA under an Emergency Use Authorization (EUA). This EUA will remain in effect (meaning this test can be used) for the duration of the COVID-19 declaration under Section 564(b)(1) of the Act, 21 U.S.C. section 360bbb-3(b)(1), unless  the authorization is terminated or revoked.     Resp Syncytial Virus by PCR NEGATIVE NEGATIVE Final    Comment: (NOTE) Fact Sheet for Patients: EntrepreneurPulse.com.au  Fact Sheet for Healthcare Providers: IncredibleEmployment.be  This test is not yet approved or cleared by the [place] FDA and has been authorized for detection and/or diagnosis of SARS-CoV-2 by FDA under an Emergency Use Authorization (EUA). This EUA will remain in effect (meaning this test can be used) for the duration of the COVID-19 declaration under Section 564(b)(1) of the Act, 21  U.S.C. section 360bbb-3(b)(1), unless the authorization is terminated or revoked.  Performed at [facility], [place] [address], [place], [place] [address]   Respiratory (~20 pathogens) panel by PCR     Status: None   Collection Time: 12/10/22 10:52 PM   Specimen: Nasopharyngeal Swab; Respiratory  Result Value Ref Range Status   Adenovirus NOT DETECTED NOT DETECTED Final   Coronavirus 229E NOT DETECTED NOT DETECTED Final    Comment: (NOTE) The Coronavirus on the Respiratory Panel, DOES NOT test for the novel  Coronavirus (2019 nCoV)    Coronavirus HKU1 NOT DETECTED NOT DETECTED Final   Coronavirus NL63 NOT DETECTED NOT DETECTED Final   Coronavirus OC43 NOT DETECTED NOT DETECTED Final   Metapneumovirus NOT DETECTED NOT DETECTED Final   Rhinovirus / Enterovirus NOT DETECTED NOT DETECTED Final   Influenza A NOT DETECTED NOT DETECTED Final   Influenza B NOT DETECTED NOT DETECTED Final   Parainfluenza Virus 1 NOT DETECTED NOT DETECTED Final   Parainfluenza Virus 2 NOT DETECTED NOT DETECTED Final   Parainfluenza Virus 3 NOT DETECTED NOT DETECTED Final   Parainfluenza Virus 4 NOT DETECTED NOT DETECTED Final   Respiratory Syncytial Virus NOT DETECTED NOT DETECTED Final   Bordetella pertussis NOT DETECTED NOT DETECTED Final   Bordetella Parapertussis NOT DETECTED NOT DETECTED Final   Chlamydophila pneumoniae NOT DETECTED NOT DETECTED Final   Mycoplasma pneumoniae NOT DETECTED NOT DETECTED Final    Comment: Performed at [facility], [place]. [address], [place], [place] [address]  Blood culture (routine x 2)     Status: None   Collection Time: 12/10/22 11:21 PM   Specimen: BLOOD  Result Value Ref Range Status   Specimen Description   Final    BLOOD RIGHT ANTECUBITAL Performed at [place] [address] [place], [place] [address]    Special Requests   Final    BOTTLES DRAWN AEROBIC AND ANAEROBIC Blood Culture adequate volume Performed at  [place] [address] [place], [place] [address]    Culture   Final    NO GROWTH 5 DAYS Performed at [facility], [place] [address], [place], [place] [address]    Report Status 12/16/2022 FINAL  Final  Blood culture (routine x 2)     Status: None   Collection Time: 12/10/22 11:32 PM   Specimen: BLOOD  Result Value Ref Range Status   Specimen Description   Final    BLOOD LEFT ANTECUBITAL Performed at [place] [address] [place], [place] [address]    Special Requests   Final    BOTTLES DRAWN AEROBIC AND ANAEROBIC Blood Culture adequate volume Performed at [place] [address], [place], [place] [address]    Culture   Final    NO GROWTH 5 DAYS Performed at [facility], [place] [address] [place],  [address]    Report  Status 12/16/2022 FINAL  Final  Group A Strep by PCR     Status: None   Collection Time: 12/11/22  8:27 AM   Specimen: Throat; Sterile Swab  Result Value Ref Range Status   Group A Strep by PCR NOT DETECTED NOT DETECTED Final    Comment: Performed at [facility], [place] [address], [place], [place] [address]  Gastrointestinal Panel by PCR , Stool     Status: None   Collection Time: 12/12/22 11:50 AM   Specimen: Stool  Result Value Ref Range Status   Campylobacter species NOT DETECTED NOT DETECTED Final   Plesimonas shigelloides NOT DETECTED NOT DETECTED Final   Salmonella species NOT DETECTED NOT DETECTED Final   Yersinia enterocolitica NOT DETECTED NOT DETECTED Final   Vibrio species NOT DETECTED NOT DETECTED Final   Vibrio cholerae NOT DETECTED NOT DETECTED Final   Enteroaggregative E coli (EAEC) NOT DETECTED NOT DETECTED Final   Enteropathogenic E coli (EPEC) NOT DETECTED NOT DETECTED Final   Enterotoxigenic E coli (ETEC) NOT DETECTED NOT DETECTED Final   Shiga like toxin producing E coli (STEC) NOT DETECTED NOT DETECTED Final    Shigella/Enteroinvasive E coli (EIEC) NOT DETECTED NOT DETECTED Final   Cryptosporidium NOT DETECTED NOT DETECTED Final   Cyclospora cayetanensis NOT DETECTED NOT DETECTED Final   Entamoeba histolytica NOT DETECTED NOT DETECTED Final   Giardia lamblia NOT DETECTED NOT DETECTED Final   Adenovirus F40/41 NOT DETECTED NOT DETECTED Final   Astrovirus NOT DETECTED NOT DETECTED Final   Norovirus GI/GII NOT DETECTED NOT DETECTED Final   Rotavirus A NOT DETECTED NOT DETECTED Final   Sapovirus (I, II, IV, and V) NOT DETECTED NOT DETECTED Final    Comment: Performed at [facility], [place] [place], [place] [address]  C Difficile Quick Screen w PCR reflex     Status: None   Collection Time: 12/12/22 11:50 AM   Specimen: STOOL  Result Value Ref Range Status   C Diff antigen NEGATIVE NEGATIVE Final   C Diff toxin NEGATIVE NEGATIVE Final   C Diff interpretation No C. difficile detected.  Final    Comment: Performed at [facility] [place], [place] [address], [place], [place] [address]    Labs: CBC: Recent Labs  Lab 12/10/22 2234 12/12/22 0423 12/12/22 1227 12/12/22 1857 12/14/22 0259 12/14/22 0816 12/15/22 0758 12/16/22 0813 12/17/22 0809  WBC 12.3*   < > 9.9  --   --  11.4* 9.5 9.4 8.7  NEUTROABS 8.9*  --   --   --   --   --   --   --   --   HGB 9.5*   < > 9.3*   < > 8.8* 9.0* 8.5* 8.4* 8.2*  HCT 30.8*   < > 31.5*   < > 28.1* 29.4* 28.0* 27.3* 26.5*  MCV 74.0*   < > 76.1*  --   --  75.6* 74.1* 75.4* 74.6*  PLT 464*   < > 332  --   --  356 365 354 334   < > = values in this interval not displayed.   Basic Metabolic Panel: Recent Labs  Lab 12/11/22 0112 12/12/22 0423 12/14/22 0816 12/15/22 0758 12/16/22 0813 12/17/22 0809  NA  --  138 134* 136 138 137  K  --  3.9 3.8 3.6 3.8 4.0  CL  --  104 101 103 106 105  CO2  --  25 25 25 24 25   GLUCOSE  --  104* 86 114* 101* 93  BUN  --  10 <5* 7* 6* 7*  CREATININE  --  0.68 0.69 0.69 0.64 0.64  CALCIUM  --   8.3* 8.1* 8.1* 8.2* 8.3*  MG 1.9  --  1.6* 2.0  --   --    Liver Function Tests: Recent Labs  Lab 12/10/22 2234 12/12/22 0423  AST 17 14*  ALT 10 11  ALKPHOS 56 43  BILITOT 0.7 0.4  PROT 8.0 6.7  ALBUMIN 2.8* 2.4*   CBG: Recent Labs  Lab 12/11/22 2046  GLUCAP 176*    Discharge time spent: greater than 30 minutes.  Signed: Elmarie Shiley, MD [facility] 12/17/2022

## 2023-01-10 ENCOUNTER — Encounter: Payer: Self-pay | Admitting: *Deleted

## 2023-02-07 ENCOUNTER — Ambulatory Visit (INDEPENDENT_AMBULATORY_CARE_PROVIDER_SITE_OTHER): Payer: BC Managed Care – PPO | Admitting: Family Medicine

## 2023-02-07 ENCOUNTER — Encounter: Payer: Self-pay | Admitting: Family Medicine

## 2023-02-07 VITALS — BP 142/84 | HR 100 | Temp 97.6°F | Ht 62.0 in | Wt 211.0 lb

## 2023-02-07 DIAGNOSIS — E559 Vitamin D deficiency, unspecified: Secondary | ICD-10-CM | POA: Diagnosis not present

## 2023-02-07 DIAGNOSIS — J40 Bronchitis, not specified as acute or chronic: Secondary | ICD-10-CM

## 2023-02-07 DIAGNOSIS — K51011 Ulcerative (chronic) pancolitis with rectal bleeding: Secondary | ICD-10-CM | POA: Diagnosis not present

## 2023-02-07 DIAGNOSIS — I10 Essential (primary) hypertension: Secondary | ICD-10-CM | POA: Diagnosis not present

## 2023-02-07 DIAGNOSIS — K760 Fatty (change of) liver, not elsewhere classified: Secondary | ICD-10-CM | POA: Diagnosis not present

## 2023-02-07 DIAGNOSIS — R0602 Shortness of breath: Secondary | ICD-10-CM

## 2023-02-07 DIAGNOSIS — D649 Anemia, unspecified: Secondary | ICD-10-CM | POA: Diagnosis not present

## 2023-02-07 DIAGNOSIS — R000 Tachycardia, unspecified: Secondary | ICD-10-CM

## 2023-02-07 LAB — COMPREHENSIVE METABOLIC PANEL
ALT: 10 U/L (ref 0–35)
AST: 17 U/L (ref 0–37)
Albumin: 4.1 g/dL (ref 3.5–5.2)
Alkaline Phosphatase: 53 U/L (ref 39–117)
BUN: 9 mg/dL (ref 6–23)
CO2: 28 mEq/L (ref 19–32)
Calcium: 9.7 mg/dL (ref 8.4–10.5)
Chloride: 104 mEq/L (ref 96–112)
Creatinine, Ser: 0.65 mg/dL (ref 0.40–1.20)
GFR: 94.64 mL/min (ref >60.00)
Glucose, Bld: 102 mg/dL — ABNORMAL HIGH (ref 70–99)
Potassium: 4.6 mEq/L (ref 3.5–5.1)
Sodium: 138 mEq/L (ref 135–145)
Total Bilirubin: 0.3 mg/dL (ref 0.2–1.2)
Total Protein: 7.9 g/dL (ref 6.0–8.3)

## 2023-02-07 LAB — CBC WITH DIFFERENTIAL/PLATELET
Basophils Absolute: 0 10*3/uL (ref 0.0–0.1)
Basophils Relative: 0.4 % (ref 0.0–3.0)
Eosinophils Absolute: 0.4 10*3/uL (ref 0.0–0.7)
Eosinophils Relative: 6.7 % — ABNORMAL HIGH (ref 0.0–5.0)
HCT: 36.4 % (ref 36.0–46.0)
Hemoglobin: 11.5 g/dL — ABNORMAL LOW (ref 12.0–15.0)
Lymphocytes Relative: 29.1 % (ref 12.0–46.0)
Lymphs Abs: 1.6 10*3/uL (ref 0.7–4.0)
MCHC: 31.6 g/dL (ref 30.0–36.0)
MCV: 77.4 fl — ABNORMAL LOW (ref 78.0–100.0)
Monocytes Absolute: 0.7 10*3/uL (ref 0.1–1.0)
Monocytes Relative: 13.2 % — ABNORMAL HIGH (ref 3.0–12.0)
Neutro Abs: 2.8 10*3/uL (ref 1.4–7.7)
Neutrophils Relative %: 50.6 % (ref 43.0–77.0)
Platelets: 292 10*3/uL (ref 150.0–400.0)
RBC: 4.71 Mil/uL (ref 3.87–5.11)
RDW: 19.7 % — ABNORMAL HIGH (ref 11.5–15.5)
WBC: 5.5 10*3/uL (ref 4.0–10.5)

## 2023-02-07 LAB — FOLATE: Folate: 12.4 ng/mL (ref >5.9)

## 2023-02-07 NOTE — Progress Notes (Signed)
New Patient Office Visit  Subjective    Patient ID: Tracey Blackburn, female    DOB: [birth year]  Age: 62 y.o. MRN: [id number]  CC:  Chief Complaint  Patient presents with   Establish Care    Would like to discuss fast heart rate sometimes, will go fast then will slow down. Didn't know about this until hospitalized when they asked her if she had pacemaker but couldn't understand why heart rate would get so high  SOB gotten real bad in the last month, March 19th was in hospital because breathing tubes were closing up  L knee pain for a long time, arthritis    HPI Tracey Blackburn presents to establish care No previous PCP Her daughter is with her and providing a great deal of information.   She was worked up in the hospital in March 2024 for blood diarrhea, pancolitis, most likely infectious vs inflammatory.  She received one unit of PRBC. Discharged on iron once daily.   She was advised to f/u with her GI at [facility]. States she needs a new referral. Cannot go back to [place].   Tachycardia was thought to be r./t dehydration and anemia.  TSH normal.  CT neg for PE HR improved after blood transfusion. She was also started on low dose metoprolol.   Bronchitis in hospital. Denies hx of asthma, COPD. Never smoked.  She was treated aggressively in the hospital with steroids, nebulizers and currently still using Stiolto and albuterol daily. Atrovent was not affordable. Still taking Mucinex bid. She is still having breathing issues. Prior to her hospital admission, she denies ever having breathing concerns.   States she is unable to walk for exercise like she used to do prior to illness in March    Outpatient Encounter Medications as of 02/07/2023  Medication Sig   albuterol (VENTOLIN HFA) 108 (90 Base) MCG/ACT inhaler Inhale 1-2 puffs into the lungs every 6 (six) hours as needed for wheezing or shortness of breath.   budesonide-formoterol (SYMBICORT) 80-4.5 MCG/ACT inhaler Inhale 2 puffs  into the lungs 2 (two) times daily.   Cyanocobalamin (B-12 PO) Take by mouth.   ferrous sulfate 325 (65 FE) MG EC tablet Take 1 tablet (325 mg total) by mouth daily with breakfast.   guaiFENesin (MUCINEX) 600 MG 12 hr tablet Take 2 tablets (1,200 mg total) by mouth 2 (two) times daily.   metoprolol tartrate (LOPRESSOR) 25 MG tablet Take 1 tablet (25 mg total) by mouth 2 (two) times daily.   Vitamin D, Ergocalciferol, (DRISDOL) 1.25 MG (50000 UNIT) CAPS capsule Take 1 capsule (50,000 Units total) by mouth every 7 (seven) days.   [DISCONTINUED] azithromycin (ZITHROMAX) 250 MG tablet Take by mouth.   [DISCONTINUED] methylPREDNISolone (MEDROL DOSEPAK) 4 MG TBPK tablet Take by mouth as directed.   ipratropium (ATROVENT HFA) 17 MCG/ACT inhaler Inhale 1 puff into the lungs 3 (three) times daily. (Patient not taking: Reported on 02/07/2023)   No facility-administered encounter medications on file as of 02/07/2023.    Past Medical History:  Diagnosis Date   Diabetes mellitus without complication (HCC)    Hypertension     Past Surgical History:  Procedure Laterality Date   BIOPSY  12/14/2022   Procedure: BIOPSY;  Surgeon: Jeani Hawking, MD;  Location: [facility];  Service: Gastroenterology;;   CESAREAN SECTION     COLONOSCOPY N/A 12/14/2022   Procedure: COLONOSCOPY;  Surgeon: Jeani Hawking, MD;  Location: [facility];  Service: Gastroenterology;  Laterality: N/A;   ESOPHAGOGASTRODUODENOSCOPY N/A 12/14/2022  Procedure: ESOPHAGOGASTRODUODENOSCOPY (EGD);  Surgeon: Jeani Hawking, MD;  Location: [facility];  Service: Gastroenterology;  Laterality: N/A;    Family History  Problem Relation Age of Onset   Diabetes Other    Hypertension Other     Social History   Socioeconomic History   Marital status: Divorced    Spouse name: Not on file   Number of children: Not on file   Years of education: Not on file   Highest education level: Not on file  Occupational History   Not on file  Tobacco  Use   Smoking status: Never   Smokeless tobacco: Never  Vaping Use   Vaping Use: Never used  Substance and Sexual Activity   Alcohol use: No    Alcohol/week: 0.0 standard drinks of alcohol   Drug use: No   Sexual activity: Not Currently  Other Topics Concern   Not on file  Social History Narrative   Not on file   Social Determinants of Health   Financial Resource Strain: Not on file  Food Insecurity: Food Insecurity Present (12/11/2022)   Hunger Vital Sign    Worried About Running Out of Food in the Last Year: Sometimes true    Ran Out of Food in the Last Year: Sometimes true  Transportation Needs: No Transportation Needs (12/11/2022)   PRAPARE - Administrator, Civil Service (Medical): No    Lack of Transportation (Non-Medical): No  Physical Activity: Not on file  Stress: Not on file  Social Connections: Not on file  Intimate Partner Violence: Not At Risk (12/11/2022)   Humiliation, Afraid, Rape, and Kick questionnaire    Fear of Current or Ex-Partner: No    Emotionally Abused: No    Physically Abused: No    Sexually Abused: No    Review of Systems  Constitutional:  Negative for chills, fever, malaise/fatigue and weight loss.  Respiratory:  Positive for cough, shortness of breath and wheezing.   Cardiovascular:  Negative for chest pain, palpitations and leg swelling.  Gastrointestinal:  Positive for diarrhea. Negative for abdominal pain, blood in stool, constipation, nausea and vomiting.  Genitourinary:  Negative for dysuria, frequency and urgency.  Musculoskeletal:  Positive for joint pain.  Neurological:  Negative for dizziness, focal weakness and headaches.  Endo/Heme/Allergies:  Does not bruise/bleed easily.  Psychiatric/Behavioral:  Negative for depression. The patient is not nervous/anxious.         Objective    BP (!) 142/84 (BP Location: Left Arm, Patient Position: Sitting, Cuff Size: Large)   Pulse 100   Temp 97.6 F (36.4 C) (Temporal)   Ht  5\' 2"  (1.575 m)   Wt 211 lb (95.7 kg)   SpO2 99%   BMI 38.59 kg/m   Physical Exam Constitutional:      General: She is not in acute distress.    Appearance: She is obese. She is not ill-appearing.  Eyes:     Extraocular Movements: Extraocular movements intact.     Conjunctiva/sclera: Conjunctivae normal.  Cardiovascular:     Rate and Rhythm: Normal rate and regular rhythm.  Pulmonary:     Effort: Pulmonary effort is normal.     Breath sounds: Normal breath sounds.  Musculoskeletal:     Cervical back: Normal range of motion and neck supple.     Right lower leg: No edema.     Left lower leg: No edema.  Skin:    General: Skin is warm and dry.  Neurological:     General: No  focal deficit present.     Mental Status: She is alert and oriented to person, place, and time.  Psychiatric:        Mood and Affect: Mood normal.        Behavior: Behavior normal.        Thought Content: Thought content normal.         Assessment & Plan:   Problem List Items Addressed This Visit       Cardiovascular and Mediastinum   Essential hypertension   Relevant Orders   CBC with Differential/Platelet (Completed)   Comprehensive metabolic panel (Completed)     Digestive   Fatty liver   Relevant Orders   Ambulatory referral to Gastroenterology   Ulcerative pancolitis with rectal bleeding (HCC) - Primary   Relevant Orders   Ambulatory referral to Gastroenterology     Other   Anemia   Relevant Medications   Cyanocobalamin (B-12 PO)   Other Relevant Orders   CBC with Differential/Platelet (Completed)   Vitamin B12 (Completed)   Iron, TIBC and Ferritin Panel (Completed)   Folate (Completed)   Ambulatory referral to Gastroenterology   Dyspnea   Relevant Orders   Ambulatory referral to Pulmonology   Tachycardia   Other Visit Diagnoses     Hypocalcemia       Relevant Orders   Comprehensive metabolic panel (Completed)   Vitamin D deficiency       Relevant Medications   Vitamin  D, Ergocalciferol, (DRISDOL) 1.25 MG (50000 UNIT) CAPS capsule   Other Relevant Orders   VITAMIN D 25 Hydroxy (Vit-D Deficiency, Fractures) (Completed)   Bronchitis       Relevant Orders   Ambulatory referral to Pulmonology      She is a pleasant 61 year old female who is new to the practice and here to establish care.  Her daughter is with her today.  Recent hospitalization for ulcerative colitis with rectal bleeding.  She was found to be severely anemic and dehydrated.  She received a blood transfusion.  She is taking oral iron.  Denies any recent bleeding or abdominal pain.  Referred to [facility].  Previously she was seen at [facility] and states she cannot go back there due to an unpaid balance. Tachycardia has improved.  She is also taking metoprolol which was started in the hospital. She is still experiencing shortness of breath.  Diagnosed with acute bronchitis in the hospital and she is still requiring use of albuterol inhaler twice daily.  No previous history of asthma or COPD.  Unable to exercise and perform her usual daily activities.  Referral to pulmonology for further evaluation and treatment. Replace vitamin D. She will monitor blood pressure at home for the next 6 weeks and follow-up with me.  I will also follow-up pending lab results.  Visit time 30 minutes in face to face communication with patient and coordination of care, additional 15 minutes spent in record review, coordination or care, ordering tests, communicating/referring to other healthcare professionals, documenting in medical records all on the same day of the visit for total time 45 minutes spent on the visit.    Return in about 6 weeks (around 03/19/2023) for chronic health conditions.   Hetty Blend, NP-C

## 2023-02-07 NOTE — Patient Instructions (Signed)
Thank you for trusting [place] with your health care.  Please go downstairs for labs before you leave.  I have referred you to [facility] gastroenterology and [facility] pulmonology. They should call you to schedule visits.  We will be in touch with your lab results from today.  Continue your current medications  Keep a record of your blood pressures and bring them to your follow-up visit with me in 6 weeks.

## 2023-02-08 LAB — IRON,TIBC AND FERRITIN PANEL
%SAT: 8 % (calc) — ABNORMAL LOW (ref 16–45)
Ferritin: 27 ng/mL (ref 16–288)
Iron: 29 ug/dL — ABNORMAL LOW (ref 45–160)
TIBC: 369 mcg/dL (calc) (ref 250–450)

## 2023-02-08 LAB — VITAMIN D 25 HYDROXY (VIT D DEFICIENCY, FRACTURES): VITD: 10.8 ng/mL — ABNORMAL LOW (ref 30.00–100.00)

## 2023-02-08 LAB — VITAMIN B12: Vitamin B-12: 784 pg/mL (ref 211–911)

## 2023-02-08 MED ORDER — VITAMIN D (ERGOCALCIFEROL) 1.25 MG (50000 UNIT) PO CAPS
50000.0000 [IU] | ORAL_CAPSULE | ORAL | 2 refills | Status: AC
Start: 2023-02-08 — End: ?

## 2023-02-08 NOTE — Progress Notes (Signed)
Her vitamin D is severely low. I will send in a once weekly prescription for her to take x 12 weeks. Continue iron supplement, her anemia is improving. Nothing else concerning on her blood work.

## 2023-02-15 ENCOUNTER — Emergency Department (HOSPITAL_COMMUNITY): Payer: BC Managed Care – PPO

## 2023-02-15 ENCOUNTER — Other Ambulatory Visit: Payer: Self-pay

## 2023-02-15 ENCOUNTER — Observation Stay (HOSPITAL_COMMUNITY)
Admission: EM | Admit: 2023-02-15 | Discharge: 2023-02-16 | Disposition: A | Discharge status: Home / Self Care | Payer: BC Managed Care – PPO | Attending: Internal Medicine | Admitting: Internal Medicine

## 2023-02-15 ENCOUNTER — Encounter (HOSPITAL_COMMUNITY): Payer: Self-pay

## 2023-02-15 DIAGNOSIS — Z1152 Encounter for screening for COVID-19: Secondary | ICD-10-CM | POA: Insufficient documentation

## 2023-02-15 DIAGNOSIS — E611 Iron deficiency: Secondary | ICD-10-CM | POA: Diagnosis not present

## 2023-02-15 DIAGNOSIS — E119 Type 2 diabetes mellitus without complications: Secondary | ICD-10-CM | POA: Insufficient documentation

## 2023-02-15 DIAGNOSIS — R000 Tachycardia, unspecified: Secondary | ICD-10-CM | POA: Diagnosis present

## 2023-02-15 DIAGNOSIS — I10 Essential (primary) hypertension: Secondary | ICD-10-CM | POA: Insufficient documentation

## 2023-02-15 DIAGNOSIS — E876 Hypokalemia: Secondary | ICD-10-CM | POA: Insufficient documentation

## 2023-02-15 DIAGNOSIS — R0602 Shortness of breath: Secondary | ICD-10-CM

## 2023-02-15 DIAGNOSIS — I160 Hypertensive urgency: Secondary | ICD-10-CM | POA: Insufficient documentation

## 2023-02-15 DIAGNOSIS — J219 Acute bronchiolitis, unspecified: Secondary | ICD-10-CM | POA: Diagnosis present

## 2023-02-15 DIAGNOSIS — R062 Wheezing: Secondary | ICD-10-CM | POA: Diagnosis not present

## 2023-02-15 DIAGNOSIS — J40 Bronchitis, not specified as acute or chronic: Secondary | ICD-10-CM | POA: Diagnosis present

## 2023-02-15 DIAGNOSIS — J9601 Acute respiratory failure with hypoxia: Secondary | ICD-10-CM | POA: Diagnosis not present

## 2023-02-15 DIAGNOSIS — R0789 Other chest pain: Secondary | ICD-10-CM | POA: Diagnosis not present

## 2023-02-15 DIAGNOSIS — Z79899 Other long term (current) drug therapy: Secondary | ICD-10-CM | POA: Diagnosis not present

## 2023-02-15 DIAGNOSIS — R0689 Other abnormalities of breathing: Secondary | ICD-10-CM | POA: Diagnosis not present

## 2023-02-15 DIAGNOSIS — R0600 Dyspnea, unspecified: Secondary | ICD-10-CM | POA: Diagnosis not present

## 2023-02-15 DIAGNOSIS — J9811 Atelectasis: Secondary | ICD-10-CM | POA: Diagnosis not present

## 2023-02-15 DIAGNOSIS — J209 Acute bronchitis, unspecified: Principal | ICD-10-CM | POA: Insufficient documentation

## 2023-02-15 LAB — COMPREHENSIVE METABOLIC PANEL
ALT: 12 U/L (ref 0–44)
AST: 19 U/L (ref 15–41)
Albumin: 3.9 g/dL (ref 3.5–5.0)
Alkaline Phosphatase: 51 U/L (ref 38–126)
Anion gap: 9 (ref 5–15)
BUN: 6 mg/dL — ABNORMAL LOW (ref 8–23)
CO2: 23 mmol/L (ref 22–32)
Calcium: 8.5 mg/dL — ABNORMAL LOW (ref 8.9–10.3)
Chloride: 104 mmol/L (ref 98–111)
Creatinine, Ser: 0.7 mg/dL (ref 0.44–1.00)
GFR, Estimated: >60 mL/min (ref >60)
Glucose, Bld: 120 mg/dL — ABNORMAL HIGH (ref 70–99)
Potassium: 3.3 mmol/L — ABNORMAL LOW (ref 3.5–5.1)
Sodium: 136 mmol/L (ref 135–145)
Total Bilirubin: 0.6 mg/dL (ref 0.3–1.2)
Total Protein: 7.9 g/dL (ref 6.5–8.1)

## 2023-02-15 LAB — CBC WITH DIFFERENTIAL/PLATELET
Abs Immature Granulocytes: 0.01 10*3/uL (ref 0.00–0.07)
Basophils Absolute: 0 10*3/uL (ref 0.0–0.1)
Basophils Relative: 0 %
Eosinophils Absolute: 0.5 10*3/uL (ref 0.0–0.5)
Eosinophils Relative: 7 %
HCT: 40 % (ref 36.0–46.0)
Hemoglobin: 12.2 g/dL (ref 12.0–15.0)
Immature Granulocytes: 0 %
Lymphocytes Relative: 25 %
Lymphs Abs: 1.7 10*3/uL (ref 0.7–4.0)
MCH: 24.5 pg — ABNORMAL LOW (ref 26.0–34.0)
MCHC: 30.5 g/dL (ref 30.0–36.0)
MCV: 80.3 fL (ref 80.0–100.0)
Monocytes Absolute: 0.7 10*3/uL (ref 0.1–1.0)
Monocytes Relative: 10 %
Neutro Abs: 4.1 10*3/uL (ref 1.7–7.7)
Neutrophils Relative %: 58 %
Platelets: 280 10*3/uL (ref 150–400)
RBC: 4.98 MIL/uL (ref 3.87–5.11)
RDW: 17.6 % — ABNORMAL HIGH (ref 11.5–15.5)
WBC: 6.9 10*3/uL (ref 4.0–10.5)
nRBC: 0 % (ref 0.0–0.2)

## 2023-02-15 LAB — BLOOD GAS, VENOUS
Acid-base deficit: 0.6 mmol/L (ref 0.0–2.0)
Bicarbonate: 24.9 mmol/L (ref 20.0–28.0)
O2 Saturation: 62.9 %
Patient temperature: 37
pCO2, Ven: 43 mmHg — ABNORMAL LOW (ref 44–60)
pH, Ven: 7.37 (ref 7.25–7.43)
pO2, Ven: 37 mmHg (ref 32–45)

## 2023-02-15 LAB — D-DIMER, QUANTITATIVE: D-Dimer, Quant: 2.48 ug/mL-FEU — ABNORMAL HIGH (ref 0.00–0.50)

## 2023-02-15 LAB — CARBOXYHEMOGLOBIN - COOX: Carboxyhemoglobin: 1.2 % (ref 0.5–1.5)

## 2023-02-15 LAB — BRAIN NATRIURETIC PEPTIDE: B Natriuretic Peptide: 20 pg/mL (ref 0.0–100.0)

## 2023-02-15 LAB — TROPONIN I (HIGH SENSITIVITY): Troponin I (High Sensitivity): 4 ng/L (ref <18)

## 2023-02-15 LAB — SARS CORONAVIRUS 2 BY RT PCR: SARS Coronavirus 2 by RT PCR: NEGATIVE

## 2023-02-15 MED ORDER — ACETAMINOPHEN 650 MG RE SUPP: 650.0000 mg | Freq: Four times a day (QID) | RECTAL | Status: DC | PRN

## 2023-02-15 MED ORDER — SODIUM CHLORIDE 0.9 % IV SOLN
1.0000 g | INTRAVENOUS | Status: DC
  Administered 2023-02-16: 1 g via INTRAVENOUS
  Filled 2023-02-15: qty 10

## 2023-02-15 MED ORDER — ENOXAPARIN SODIUM 40 MG/0.4ML IJ SOSY
40.0000 mg | PREFILLED_SYRINGE | INTRAMUSCULAR | Status: DC
  Administered 2023-02-15: 40 mg via SUBCUTANEOUS
  Filled 2023-02-15: qty 0.4

## 2023-02-15 MED ORDER — METOPROLOL TARTRATE 25 MG PO TABS
25.0000 mg | ORAL_TABLET | Freq: Two times a day (BID) | ORAL | Status: DC
  Administered 2023-02-15 – 2023-02-16 (×2): 25 mg via ORAL
  Filled 2023-02-15 (×2): qty 1

## 2023-02-15 MED ORDER — MAGNESIUM SULFATE 2 GM/50ML IV SOLN
2.0000 g | Freq: Once | INTRAVENOUS | Status: AC
  Administered 2023-02-15: 2 g via INTRAVENOUS
  Filled 2023-02-15: qty 50

## 2023-02-15 MED ORDER — DOXYCYCLINE HYCLATE 100 MG PO TABS
100.0000 mg | ORAL_TABLET | Freq: Once | ORAL | Status: AC
  Administered 2023-02-15: 100 mg via ORAL
  Filled 2023-02-15: qty 1

## 2023-02-15 MED ORDER — IPRATROPIUM-ALBUTEROL 0.5-2.5 (3) MG/3ML IN SOLN
3.0000 mL | Freq: Once | RESPIRATORY_TRACT | Status: AC
  Administered 2023-02-15: 3 mL via RESPIRATORY_TRACT
  Filled 2023-02-15: qty 3

## 2023-02-15 MED ORDER — SODIUM CHLORIDE 0.9 % IV SOLN
500.0000 mg | INTRAVENOUS | Status: DC
  Administered 2023-02-15: 500 mg via INTRAVENOUS
  Filled 2023-02-15: qty 5

## 2023-02-15 MED ORDER — IOHEXOL 350 MG/ML SOLN
75.0000 mL | Freq: Once | INTRAVENOUS | Status: AC | PRN
  Administered 2023-02-15: 75 mL via INTRAVENOUS

## 2023-02-15 MED ORDER — SODIUM CHLORIDE 0.9 % IV BOLUS (SEPSIS)
500.0000 mL | Freq: Once | INTRAVENOUS | Status: AC
  Administered 2023-02-15: 500 mL via INTRAVENOUS

## 2023-02-15 MED ORDER — POLYETHYLENE GLYCOL 3350 17 G PO PACK: 17.0000 g | PACK | Freq: Every day | ORAL | Status: DC | PRN

## 2023-02-15 MED ORDER — ALBUTEROL SULFATE (2.5 MG/3ML) 0.083% IN NEBU: 2.5000 mg | INHALATION_SOLUTION | RESPIRATORY_TRACT | Status: DC | PRN

## 2023-02-15 MED ORDER — HYDRALAZINE HCL 20 MG/ML IJ SOLN: 5.0000 mg | Freq: Once | INTRAMUSCULAR | Status: DC

## 2023-02-15 MED ORDER — IPRATROPIUM-ALBUTEROL 0.5-2.5 (3) MG/3ML IN SOLN
3.0000 mL | Freq: Four times a day (QID) | RESPIRATORY_TRACT | Status: DC
  Administered 2023-02-15 – 2023-02-16 (×5): 3 mL via RESPIRATORY_TRACT
  Filled 2023-02-15 (×5): qty 3

## 2023-02-15 MED ORDER — DOCUSATE SODIUM 100 MG PO CAPS
100.0000 mg | ORAL_CAPSULE | Freq: Two times a day (BID) | ORAL | Status: DC
  Administered 2023-02-15: 100 mg via ORAL
  Filled 2023-02-15 (×2): qty 1

## 2023-02-15 MED ORDER — POTASSIUM CHLORIDE 20 MEQ PO PACK
40.0000 meq | PACK | Freq: Once | ORAL | Status: AC
  Administered 2023-02-15: 40 meq via ORAL
  Filled 2023-02-15: qty 2

## 2023-02-15 MED ORDER — ONDANSETRON HCL 4 MG PO TABS: 4.0000 mg | ORAL_TABLET | Freq: Four times a day (QID) | ORAL | Status: DC | PRN

## 2023-02-15 MED ORDER — ACETAMINOPHEN 325 MG PO TABS: 650.0000 mg | ORAL_TABLET | Freq: Four times a day (QID) | ORAL | Status: DC | PRN

## 2023-02-15 MED ORDER — BENZONATATE 100 MG PO CAPS
100.0000 mg | ORAL_CAPSULE | Freq: Three times a day (TID) | ORAL | Status: DC | PRN
  Administered 2023-02-15: 100 mg via ORAL
  Filled 2023-02-15: qty 1

## 2023-02-15 MED ORDER — SODIUM CHLORIDE 0.9 % IV SOLN
1.0000 g | Freq: Once | INTRAVENOUS | Status: AC
  Administered 2023-02-15: 1 g via INTRAVENOUS
  Filled 2023-02-15: qty 10

## 2023-02-15 MED ORDER — HYDRALAZINE HCL 20 MG/ML IJ SOLN
10.0000 mg | Freq: Four times a day (QID) | INTRAMUSCULAR | Status: DC | PRN
  Administered 2023-02-15: 10 mg via INTRAVENOUS
  Filled 2023-02-15: qty 1

## 2023-02-15 MED ORDER — METHYLPREDNISOLONE SODIUM SUCC 40 MG IJ SOLR
40.0000 mg | Freq: Two times a day (BID) | INTRAMUSCULAR | Status: DC
  Administered 2023-02-15 – 2023-02-16 (×2): 40 mg via INTRAVENOUS
  Filled 2023-02-15 (×2): qty 1

## 2023-02-15 MED ORDER — ONDANSETRON HCL 4 MG/2ML IJ SOLN: 4.0000 mg | Freq: Four times a day (QID) | INTRAMUSCULAR | Status: DC | PRN

## 2023-02-15 MED ORDER — GUAIFENESIN 100 MG/5ML PO LIQD: 5.0000 mL | ORAL | Status: DC | PRN

## 2023-02-15 MED ORDER — TRAZODONE HCL 50 MG PO TABS: 25.0000 mg | ORAL_TABLET | Freq: Every evening | ORAL | Status: DC | PRN

## 2023-02-15 MED ORDER — LABETALOL HCL 5 MG/ML IV SOLN
10.0000 mg | Freq: Once | INTRAVENOUS | Status: AC
  Administered 2023-02-15: 10 mg via INTRAVENOUS
  Filled 2023-02-15: qty 4

## 2023-02-15 NOTE — H&P (Signed)
History and Physical  Tracey Blackburn GLO:[id number] DOB: 05-Jul-1961 DOA: 02/15/2023  PCP: Avanell Shackleton, NP-C   Chief Complaint: cough, shortness of breath   HPI: Tracey Blackburn is a 62 y.o. female with medical history significant for obesity, hypertension recent admission March 2024 for bronchitis being admitted to the hospital with cough, shortness of breath, acute hypoxic respiratory failure due to recurrent bronchitis.  The patient states after being discharged from the hospital in March, she has been doing well, she completed a course of oral prednisone as an outpatient, has been continuing to take Symbicort twice daily as well as metoprolol which was prescribed for hypertension.  She was doing well until she woke up early this morning about 3 AM feeling short of breath, she then started to cough which was productive of thick yellow sputum.  Denies any, chills, chest pain, nausea.  She had diarrhea last time she was here, but she is not currently having any GI symptoms.  She called EMS this morning, who gave her breathing treatments and steroid injection.  ED Course: In the emergency department, she was found to be tachycardic and hypertensive as high as 204/96.  She was given IV labetalol.  She did not take any of her medications this morning.  She is saturating 99% on room air, however with ambulation she desaturates to 75% on room air.  She was given IV Rocephin after CT scan ruled out PE and showed evidence of bronchitis.  Review of Systems: Please see HPI for pertinent positives and negatives. A complete 10 system review of systems are otherwise negative.  Past Medical History:  Diagnosis Date   Diabetes mellitus without complication (HCC)    Hypertension    Past Surgical History:  Procedure Laterality Date   BIOPSY  12/14/2022   Procedure: BIOPSY;  Surgeon: Jeani Hawking, MD;  Location: [facility];  Service: Gastroenterology;;   CESAREAN SECTION     COLONOSCOPY N/A 12/14/2022    Procedure: COLONOSCOPY;  Surgeon: Jeani Hawking, MD;  Location: [facility];  Service: Gastroenterology;  Laterality: N/A;   ESOPHAGOGASTRODUODENOSCOPY N/A 12/14/2022   Procedure: ESOPHAGOGASTRODUODENOSCOPY (EGD);  Surgeon: Jeani Hawking, MD;  Location: [facility];  Service: Gastroenterology;  Laterality: N/A;    Social History:  reports that she has never smoked. She has never used smokeless tobacco. She reports that she does not drink alcohol and does not use drugs.   No Known Allergies  Family History  Problem Relation Age of Onset   Diabetes Other    Hypertension Other      Prior to Admission medications   Medication Sig Start Date End Date Taking? Authorizing Provider  albuterol (VENTOLIN HFA) 108 (90 Base) MCG/ACT inhaler Inhale 1-2 puffs into the lungs every 6 (six) hours as needed for wheezing or shortness of breath. 12/17/22   Regalado, Belkys A, MD  budesonide-formoterol (SYMBICORT) 80-4.5 MCG/ACT inhaler Inhale 2 puffs into the lungs 2 (two) times daily. 12/17/22   Regalado, Belkys A, MD  Cyanocobalamin (B-12 PO) Take by mouth.    [provider]  ferrous sulfate 325 (65 FE) MG EC tablet Take 1 tablet (325 mg total) by mouth daily with breakfast. 12/17/22 12/17/23  Regalado, Jon Billings A, MD  guaiFENesin (MUCINEX) 600 MG 12 hr tablet Take 2 tablets (1,200 mg total) by mouth 2 (two) times daily. 12/17/22   Regalado, Belkys A, MD  ipratropium (ATROVENT HFA) 17 MCG/ACT inhaler Inhale 1 puff into the lungs 3 (three) times daily. Patient not taking: Reported on 02/07/2023 12/17/22  12/17/23  Regalado, Belkys A, MD  metoprolol tartrate (LOPRESSOR) 25 MG tablet Take 1 tablet (25 mg total) by mouth 2 (two) times daily. 12/17/22   Regalado, Belkys A, MD  Vitamin D, Ergocalciferol, (DRISDOL) 1.25 MG (50000 UNIT) CAPS capsule Take 1 capsule (50,000 Units total) by mouth every 7 (seven) days. 02/08/23   Avanell Shackleton, NP-C    Physical Exam: BP (!) 184/99   Pulse (!) 106   Temp 98 F  (36.7 C) (Oral)   Resp (!) 27   SpO2 98%   General:  Alert, oriented, calm, in no acute distress, nontoxic in appearance Eyes: EOMI, clear conjuctivae, white sclerea Neck: supple, no masses, trachea mildline  Cardiovascular: RRR, no murmurs or rubs, no peripheral edema  Respiratory: Breathing comfortably, no tachypnea, no retractions, however she has audible upper airway wheezing and rhonchi.  Able to speak in full sentences, no cough. Abdomen: soft, nontender, nondistended, normal bowel tones heard  Skin: dry, no rashes  Musculoskeletal: no joint effusions, normal range of motion  Psychiatric: appropriate affect, normal speech  Neurologic: extraocular muscles intact, clear speech, moving all extremities with intact sensorium          Labs on Admission:  Basic Metabolic Panel: Recent Labs  Lab 02/15/23 0925  NA 136  K 3.3*  CL 104  CO2 23  GLUCOSE 120*  BUN 6*  CREATININE 0.70  CALCIUM 8.5*   Liver Function Tests: Recent Labs  Lab 02/15/23 0925  AST 19  ALT 12  ALKPHOS 51  BILITOT 0.6  PROT 7.9  ALBUMIN 3.9   No results for input(s): "LIPASE", "AMYLASE" in the last 168 hours. No results for input(s): "AMMONIA" in the last 168 hours. CBC: Recent Labs  Lab 02/15/23 0925  WBC 6.9  NEUTROABS 4.1  HGB 12.2  HCT 40.0  MCV 80.3  PLT 280   Cardiac Enzymes: No results for input(s): "CKTOTAL", "CKMB", "CKMBINDEX", "TROPONINI" in the last 168 hours.  BNP (last 3 results) Recent Labs    12/10/22 2234 02/15/23 0925  BNP 23.4 20.0    ProBNP (last 3 results) No results for input(s): "PROBNP" in the last 8760 hours.  CBG: No results for input(s): "GLUCAP" in the last 168 hours.  Radiological Exams on Admission: CT Angio Chest PE W and/or Wo Contrast  Result Date: 02/15/2023 CLINICAL DATA:  Tachycardia with shortness of breath and elevated D-dimer. EXAM: CT ANGIOGRAPHY CHEST WITH CONTRAST TECHNIQUE: Multidetector CT imaging of the chest was performed using  the standard protocol during bolus administration of intravenous contrast. Multiplanar CT image reconstructions and MIPs were obtained to evaluate the vascular anatomy. RADIATION DOSE REDUCTION: This exam was performed according to the departmental dose-optimization program which includes automated exposure control, adjustment of the mA and/or kV according to patient size and/or use of iterative reconstruction technique. CONTRAST:  75mL OMNIPAQUE IOHEXOL 350 MG/ML SOLN COMPARISON:  12/11/2022 FINDINGS: Cardiovascular: The heart size is normal. No substantial pericardial effusion. No thoracic aortic aneurysm. No substantial atherosclerosis of the thoracic aorta. There is no filling defect within the opacified pulmonary arteries to suggest the presence of an acute pulmonary embolus. Mediastinum/Nodes: No mediastinal lymphadenopathy. There is no hilar lymphadenopathy. The esophagus has normal imaging features. Lungs/Pleura: Dependent atelectasis noted lower lungs bilaterally. Circumferential bronchial wall thickening noted with peripheral small airway impaction noted in the dependent lower lungs bilaterally. No focal consolidation or pulmonary edema. No pleural effusion. Upper Abdomen: Similar 2.7 cm hypoattenuating lesion in the central liver , likely a cyst. Additional  tiny hypodense liver lesions are too small to characterize but likely benign. No followup imaging is recommended. Visualized upper abdomen otherwise unremarkable. Musculoskeletal: No worrisome lytic or sclerotic osseous abnormality. Review of the MIP images confirms the above findings. IMPRESSION: 1. No CT evidence for acute pulmonary embolus. 2. Circumferential bronchial wall thickening with peripheral small airway impaction in the dependent lower lungs bilaterally. Imaging features may be related to infectious/inflammatory bronchitis. Electronically Signed   By: Kennith Center M.D.   On: 02/15/2023 12:31   DG Chest Portable 1 View  Result Date:  02/15/2023 CLINICAL DATA:  Provided history: Dyspnea.  Recent bronchitis. EXAM: PORTABLE CHEST 1 VIEW COMPARISON:  Chest CT 12/11/2022. Prior chest radiographs 12/10/2022 and earlier. FINDINGS: A portion of the left lateral costophrenic angle is excluded from the field of view. Within this limitation, findings are as follows. Borderline cardiomegaly. No appreciable airspace consolidation or pulmonary edema. No evidence of pleural effusion or pneumothorax. No acute osseous abnormality identified. IMPRESSION: 1. A portion of the left lateral costophrenic angle is excluded from the field of view. 2. Within this limitation, there is no evidence of an acute cardiopulmonary abnormality. 3. Borderline cardiomegaly. Electronically Signed   By: Jackey Loge D.O.   On: 02/15/2023 10:11    Assessment/Plan This is a pleasant 62 year old female with a history of obesity, hypertension and recent admission in March 2024 for bronchitis being admitted to the hospital with bronchitis complicated by acute hypoxic respiratory failure with ambulation. -Inpatient admission -Scheduled DuoNebs, as needed albuterol inhaler -Supplemental oxygen as needed, though she is currently stable on room air at rest -Empiric IV azithromycin and Rocephin -Continue IV steroids 40 mg IV Solu-Medrol every 12 hours  Hypertension-continue home metoprolol, add as needed IV hydralazine  Tachycardia-this seems to be persistent problem for her, likely exacerbated currently by her respiratory symptoms.  Continue metoprolol.  Consider Xopenex in place of albuterol if tachycardia worsens.  Iron deficiency-continue oral iron replacement  Hypokalemia-given potassium supplementation, recheck potassium level with morning labs  Obesity-complicating all aspects of care  DVT prophylaxis: Lovenox     Code Status: Full Code  Consults called: None  Admission status: The appropriate patient status for this patient is INPATIENT. Inpatient status is  judged to be reasonable and necessary in order to provide the required intensity of service to ensure the patient's safety. The patient's presenting symptoms, physical exam findings, and initial radiographic and laboratory data in the context of their chronic comorbidities is felt to place them at high risk for further clinical deterioration. Furthermore, it is not anticipated that the patient will be medically stable for discharge from the hospital within 2 midnights of admission.    I certify that at the point of admission it is my clinical judgment that the patient will require inpatient hospital care spanning beyond 2 midnights from the point of admission due to high intensity of service, high risk for further deterioration and high frequency of surveillance required  Time spent: 53 minutes  Jacquelinne Speak Sharlette Dense MD [facility] Pager [phone]  If 7PM-7AM, please contact night-coverage [email or web address] Password [email or web address]  02/15/2023, 2:15 PM

## 2023-02-15 NOTE — ED Notes (Signed)
When walking pt her o2 drop to 75 pulse was 125

## 2023-02-15 NOTE — ED Notes (Signed)
ED TO INPATIENT HANDOFF REPORT  Name/Age/Gender Tracey Blackburn 62 y.o. female  Code Status    Code Status Orders  (From admission, onward)           Start     Ordered   02/15/23 1404  Full code  Continuous       Question:  By:  Answer:  Consent: discussion documented in EHR   02/15/23 1404           Code Status History     Date Active Date Inactive Code Status Order ID Comments User Context   12/11/2022 0906 12/17/2022 1918 Full Code [id number]  Maryln Gottron, MD ED       Home/SNF/Other Home  Chief Complaint Bronchitis [J40]  Level of Care/Admitting Diagnosis ED Disposition     ED Disposition  Admit   Condition  --   Comment  Hospital Area: [facility]  HOSPITAL [[id number]]  Level of Care: Progressive [102]  Admit to Progressive based on following criteria: CARDIOVASCULAR & THORACIC of moderate stability with acute coronary syndrome symptoms/low risk myocardial infarction/hypertensive urgency/arrhythmias/heart failure potentially compromising stability and stable post cardiovascular intervention patients.  May admit patient to [facility] or [facility] if equivalent level of care is available:: Yes  Covid Evaluation: Asymptomatic - no recent exposure (last 10 days) testing not required  Diagnosis: Bronchitis [242301]  Admitting Physician: Maryln Gottron [[id number]]  Attending Physician: Kirby Crigler, MIR [email or web address] [[id number]]  Certification:: I certify this patient will need inpatient services for at least 2 midnights  Estimated Length of Stay: 3          Medical History Past Medical History:  Diagnosis Date   Diabetes mellitus without complication (HCC)    Hypertension     Allergies No Known Allergies  IV Location/Drains/Wounds Patient Lines/Drains/Airways Status     Active Line/Drains/Airways     Name Placement date Placement time Site Days   Peripheral IV 02/15/23 18 G Left Antecubital 02/15/23  0914  Antecubital  less than 1             Labs/Imaging Results for orders placed or performed during the hospital encounter of 02/15/23 (from the past 48 hour(s))  Blood gas, venous     Status: Abnormal   Collection Time: 02/15/23  9:25 AM  Result Value Ref Range   pH, Ven 7.37 7.25 - 7.43   pCO2, Ven 43 (L) 44 - 60 mmHg   pO2, Ven 37 32 - 45 mmHg   Bicarbonate 24.9 20.0 - 28.0 mmol/L   Acid-base deficit 0.6 0.0 - 2.0 mmol/L   O2 Saturation 62.9 %   Patient temperature 37.0     Comment: Performed at [facility], [address], [place], [place] [address]  Comprehensive metabolic panel     Status: Abnormal   Collection Time: 02/15/23  9:25 AM  Result Value Ref Range   Sodium 136 135 - 145 mmol/L   Potassium 3.3 (L) 3.5 - 5.1 mmol/L   Chloride 104 98 - 111 mmol/L   CO2 23 22 - 32 mmol/L   Glucose, Bld 120 (H) 70 - 99 mg/dL    Comment: Glucose reference range applies only to samples taken after fasting for at least 8 hours.   BUN 6 (L) 8 - 23 mg/dL   Creatinine, Ser 8.65 0.44 - 1.00 mg/dL   Calcium 8.5 (L) 8.9 - 10.3 mg/dL   Total Protein 7.9 6.5 - 8.1 g/dL   Albumin 3.9 3.5 - 5.0 g/dL  AST 19 15 - 41 U/L   ALT 12 0 - 44 U/L   Alkaline Phosphatase 51 38 - 126 U/L   Total Bilirubin 0.6 0.3 - 1.2 mg/dL   GFR, Estimated >60 >45 mL/min    Comment: (NOTE) Calculated using the CKD-EPI Creatinine Equation (2021)    Anion gap 9 5 - 15    Comment: Performed at [facility] [address] [place], [place] [address]  CBC with Differential     Status: Abnormal   Collection Time: 02/15/23  9:25 AM  Result Value Ref Range   WBC 6.9 4.0 - 10.5 K/uL   RBC 4.98 3.87 - 5.11 MIL/uL   Hemoglobin 12.2 12.0 - 15.0 g/dL   HCT 19.1 47.8 - 29.5 %   MCV 80.3 80.0 - 100.0 fL   MCH 24.5 (L) 26.0 - 34.0 pg   MCHC 30.5 30.0 - 36.0 g/dL   RDW 62.1 (H) 30.8 - 65.7 %   Platelets 280 150 - 400 K/uL   nRBC 0.0 0.0 - 0.2 %   Neutrophils Relative % 58 %   Neutro Abs 4.1 1.7 - 7.7 K/uL    Lymphocytes Relative 25 %   Lymphs Abs 1.7 0.7 - 4.0 K/uL   Monocytes Relative 10 %   Monocytes Absolute 0.7 0.1 - 1.0 K/uL   Eosinophils Relative 7 %   Eosinophils Absolute 0.5 0.0 - 0.5 K/uL   Basophils Relative 0 %   Basophils Absolute 0.0 0.0 - 0.1 K/uL   Immature Granulocytes 0 %   Abs Immature Granulocytes 0.01 0.00 - 0.07 K/uL    Comment: Performed at [facility], [address], [place], [place] [address]  Troponin I (High Sensitivity)     Status: None   Collection Time: 02/15/23  9:25 AM  Result Value Ref Range   Troponin I (High Sensitivity) 4 <18 ng/L    Comment: (NOTE) Elevated high sensitivity troponin I (hsTnI) values and significant  changes across serial measurements may suggest ACS but many other  chronic and acute conditions are known to elevate hsTnI results.  Refer to the "Links" section for chest pain algorithms and additional  guidance. Performed at [facility], [address] [place], [place] [address]   Brain natriuretic peptide     Status: None   Collection Time: 02/15/23  9:25 AM  Result Value Ref Range   B Natriuretic Peptide 20.0 0.0 - 100.0 pg/mL    Comment: Performed at [facility], [address] [place], [place] [address]  SARS Coronavirus 2 by RT PCR (hospital order, performed in [facility] hospital lab) *cepheid single result test* Anterior Nasal Swab     Status: None   Collection Time: 02/15/23  9:25 AM   Specimen: Anterior Nasal Swab  Result Value Ref Range   SARS Coronavirus 2 by RT PCR NEGATIVE NEGATIVE    Comment: (NOTE) SARS-CoV-2 target nucleic acids are NOT DETECTED.  The SARS-CoV-2 RNA is generally detectable in upper and lower respiratory specimens during the acute phase of infection. The lowest concentration of SARS-CoV-2 viral copies this assay can detect is 250 copies / mL. A negative result does not preclude SARS-CoV-2 infection and should not be used as the sole  basis for treatment or other patient management decisions.  A negative result may occur with improper specimen collection / handling, submission of specimen other than nasopharyngeal swab, presence of viral mutation(s) within the areas targeted by this assay, and inadequate  number of viral copies (<250 copies / mL). A negative result must be combined with clinical observations, patient history, and epidemiological information.  Fact Sheet for Patients:   RoadLapTop.co.za  Fact Sheet for Healthcare Providers: http://kim-miller.com/  This test is not yet approved or  cleared by the [place] FDA and has been authorized for detection and/or diagnosis of SARS-CoV-2 by FDA under an Emergency Use Authorization (EUA).  This EUA will remain in effect (meaning this test can be used) for the duration of the COVID-19 declaration under Section 564(b)(1) of the Act, 21 U.S.C. section 360bbb-3(b)(1), unless the authorization is terminated or revoked sooner.  Performed at [facility], [address] [place], [place] [address]   D-dimer, quantitative     Status: Abnormal   Collection Time: 02/15/23  9:25 AM  Result Value Ref Range   D-Dimer, Quant 2.48 (H) 0.00 - 0.50 ug/mL-FEU    Comment: (NOTE) At the manufacturer cut-off value of 0.5 g/mL FEU, this assay has a negative predictive value of 95-100%.This assay is intended for use in conjunction with a clinical pretest probability (PTP) assessment model to exclude pulmonary embolism (PE) and deep venous thrombosis (DVT) in outpatients suspected of PE or DVT. Results should be correlated with clinical presentation. Performed at [facility], [address] [place], [place] [address]   Carboxyhemoglobin (single result)     Status: None   Collection Time: 02/15/23 11:30 AM  Result Value Ref Range   Carboxyhemoglobin 1.2 0.5 - 1.5 %    Comment: Performed  at [facility], [address] [place], [place] [address]   CT Angio Chest PE W and/or Wo Contrast  Result Date: 02/15/2023 CLINICAL DATA:  Tachycardia with shortness of breath and elevated D-dimer. EXAM: CT ANGIOGRAPHY CHEST WITH CONTRAST TECHNIQUE: Multidetector CT imaging of the chest was performed using the standard protocol during bolus administration of intravenous contrast. Multiplanar CT image reconstructions and MIPs were obtained to evaluate the vascular anatomy. RADIATION DOSE REDUCTION: This exam was performed according to the departmental dose-optimization program which includes automated exposure control, adjustment of the mA and/or kV according to patient size and/or use of iterative reconstruction technique. CONTRAST:  75mL OMNIPAQUE IOHEXOL 350 MG/ML SOLN COMPARISON:  12/11/2022 FINDINGS: Cardiovascular: The heart size is normal. No substantial pericardial effusion. No thoracic aortic aneurysm. No substantial atherosclerosis of the thoracic aorta. There is no filling defect within the opacified pulmonary arteries to suggest the presence of an acute pulmonary embolus. Mediastinum/Nodes: No mediastinal lymphadenopathy. There is no hilar lymphadenopathy. The esophagus has normal imaging features. Lungs/Pleura: Dependent atelectasis noted lower lungs bilaterally. Circumferential bronchial wall thickening noted with peripheral small airway impaction noted in the dependent lower lungs bilaterally. No focal consolidation or pulmonary edema. No pleural effusion. Upper Abdomen: Similar 2.7 cm hypoattenuating lesion in the central liver , likely a cyst. Additional tiny hypodense liver lesions are too small to characterize but likely benign. No followup imaging is recommended. Visualized upper abdomen otherwise unremarkable. Musculoskeletal: No worrisome lytic or sclerotic osseous abnormality. Review of the MIP images confirms the above findings. IMPRESSION: 1. No CT evidence for acute  pulmonary embolus. 2. Circumferential bronchial wall thickening with peripheral small airway impaction in the dependent lower lungs bilaterally. Imaging features may be related to infectious/inflammatory bronchitis. Electronically Signed   By: Kennith Center M.D.   On: 02/15/2023 12:31   DG Chest Portable 1 View  Result Date: 02/15/2023 CLINICAL DATA:  Provided history: Dyspnea.  Recent bronchitis. EXAM:  PORTABLE CHEST 1 VIEW COMPARISON:  Chest CT 12/11/2022. Prior chest radiographs 12/10/2022 and earlier. FINDINGS: A portion of the left lateral costophrenic angle is excluded from the field of view. Within this limitation, findings are as follows. Borderline cardiomegaly. No appreciable airspace consolidation or pulmonary edema. No evidence of pleural effusion or pneumothorax. No acute osseous abnormality identified. IMPRESSION: 1. A portion of the left lateral costophrenic angle is excluded from the field of view. 2. Within this limitation, there is no evidence of an acute cardiopulmonary abnormality. 3. Borderline cardiomegaly. Electronically Signed   By: Jackey Loge D.O.   On: 02/15/2023 10:11    Pending Labs Unresulted Labs (From admission, onward)     Start     Ordered   02/16/23 0500  Basic metabolic panel  Tomorrow morning,   R        02/15/23 1404   02/16/23 0500  CBC  Tomorrow morning,   R        02/15/23 1404            Vitals/Pain Today's Vitals   02/15/23 1004 02/15/23 1230 02/15/23 1314 02/15/23 1345  BP:  (!) 204/96  (!) 184/99  Pulse:  (!) 118  (!) 106  Resp:  20  (!) 27  Temp:   98 F (36.7 C)   TempSrc:   Oral   SpO2:  99%  98%  PainSc: 0-No pain       Isolation Precautions No active isolations  Medications Medications  cefTRIAXone (ROCEPHIN) 1 g in sodium chloride 0.9 % 100 mL IVPB (has no administration in time range)  azithromycin (ZITHROMAX) 500 mg in sodium chloride 0.9 % 250 mL IVPB (has no administration in time range)  metoprolol tartrate (LOPRESSOR)  tablet 25 mg (has no administration in time range)  enoxaparin (LOVENOX) injection 40 mg (has no administration in time range)  acetaminophen (TYLENOL) tablet 650 mg (has no administration in time range)    Or  acetaminophen (TYLENOL) suppository 650 mg (has no administration in time range)  traZODone (DESYREL) tablet 25 mg (has no administration in time range)  ondansetron (ZOFRAN) tablet 4 mg (has no administration in time range)    Or  ondansetron (ZOFRAN) injection 4 mg (has no administration in time range)  docusate sodium (COLACE) capsule 100 mg (has no administration in time range)  polyethylene glycol (MIRALAX / GLYCOLAX) packet 17 g (has no administration in time range)  albuterol (PROVENTIL) (2.5 MG/3ML) 0.083% nebulizer solution 2.5 mg (has no administration in time range)  hydrALAZINE (APRESOLINE) injection 10 mg (has no administration in time range)  methylPREDNISolone sodium succinate (SOLU-MEDROL) 40 mg/mL injection 40 mg (has no administration in time range)  ipratropium-albuterol (DUONEB) 0.5-2.5 (3) MG/3ML nebulizer solution 3 mL (has no administration in time range)  sodium chloride 0.9 % bolus 500 mL (0 mLs Intravenous Stopped 02/15/23 1050)  ipratropium-albuterol (DUONEB) 0.5-2.5 (3) MG/3ML nebulizer solution 3 mL (3 mLs Nebulization Given 02/15/23 0957)  magnesium sulfate IVPB 2 g 50 mL (0 g Intravenous Stopped 02/15/23 1041)  ipratropium-albuterol (DUONEB) 0.5-2.5 (3) MG/3ML nebulizer solution 3 mL (3 mLs Nebulization Given 02/15/23 1019)  potassium chloride (KLOR-CON) packet 40 mEq (40 mEq Oral Given 02/15/23 1039)  doxycycline (VIBRA-TABS) tablet 100 mg (100 mg Oral Given 02/15/23 1039)  iohexol (OMNIPAQUE) 350 MG/ML injection 75 mL (75 mLs Intravenous Contrast Given 02/15/23 1153)  ipratropium-albuterol (DUONEB) 0.5-2.5 (3) MG/3ML nebulizer solution 3 mL (3 mLs Nebulization Given 02/15/23 1213)  labetalol (NORMODYNE) injection 10 mg (10 mg  Intravenous Given 02/15/23 1242)   cefTRIAXone (ROCEPHIN) 1 g in sodium chloride 0.9 % 100 mL IVPB (1 g Intravenous New Bag/Given 02/15/23 1243)    Mobility walks

## 2023-02-15 NOTE — ED Provider Notes (Signed)
[facility]PARTMENT AT [facility] Provider Note   CSN: [id number] Arrival date & time: 02/15/23  4098     History  Chief Complaint  Patient presents with   Shortness of Breath    Nakaya Railsback is a 62 y.o. female.  With PMH of HTN, DM recently admitted back in March 2024 for episode of bronchitis requiring steroids and breathing treatments who presents today with increased work of breathing, wheezing and productive cough over the past 2 days.  Patient called EMS this morning due to increased work of breathing and shortness of breath.  She was given 2 DuoNebs and Solu-Medrol with improvement however she is still complaining of productive cough and shortness of breath.  She denies any associated chest pain.  Cough has been productive of yellow sputum.  No fevers, no chills, no congestion rhinorrhea or sore throat.  She has no history of PE or DVT.  She denies any history of CHF hide no recent weight gain.  No swelling in the lower extremities.  Dyspnea is worse on exertion with any movement.   Shortness of Breath      Home Medications Prior to Admission medications   Medication Sig Start Date End Date Taking? Authorizing Provider  albuterol (VENTOLIN HFA) 108 (90 Base) MCG/ACT inhaler Inhale 1-2 puffs into the lungs every 6 (six) hours as needed for wheezing or shortness of breath. 12/17/22   Regalado, Belkys A, MD  budesonide-formoterol (SYMBICORT) 80-4.5 MCG/ACT inhaler Inhale 2 puffs into the lungs 2 (two) times daily. 12/17/22   Regalado, Belkys A, MD  Cyanocobalamin (B-12 PO) Take by mouth.    [provider]  ferrous sulfate 325 (65 FE) MG EC tablet Take 1 tablet (325 mg total) by mouth daily with breakfast. 12/17/22 12/17/23  Regalado, Jon Billings A, MD  guaiFENesin (MUCINEX) 600 MG 12 hr tablet Take 2 tablets (1,200 mg total) by mouth 2 (two) times daily. 12/17/22   Regalado, Belkys A, MD  ipratropium (ATROVENT HFA) 17 MCG/ACT inhaler Inhale 1 puff into  the lungs 3 (three) times daily. Patient not taking: Reported on 02/07/2023 12/17/22 12/17/23  Hartley Barefoot A, MD  metoprolol tartrate (LOPRESSOR) 25 MG tablet Take 1 tablet (25 mg total) by mouth 2 (two) times daily. 12/17/22   Regalado, Belkys A, MD  Vitamin D, Ergocalciferol, (DRISDOL) 1.25 MG (50000 UNIT) CAPS capsule Take 1 capsule (50,000 Units total) by mouth every 7 (seven) days. 02/08/23   Avanell Shackleton, NP-C      Allergies    Patient has no known allergies.    Review of Systems   Review of Systems  Respiratory:  Positive for shortness of breath.     Physical Exam Updated Vital Signs BP (!) 204/96   Pulse (!) 118   Temp 98 F (36.7 C) (Oral)   Resp 20   SpO2 99%  Physical Exam Constitutional: Alert and oriented.  Chronically ill-appearing female speaking in broken sentences Eyes: Conjunctivae are normal. ENT      Head: Normocephalic and atraumatic.      Neck: No stridor. Cardiovascular: Tachycardic, regular rhythm Respiratory: Tachypnea with mixed rhonchi and end expiratory wheezing, O2 sat 99 on RA, speaking in broken sentences Gastrointestinal: Soft and nontender. Musculoskeletal: Normal range of motion in all extremities. Nonpitting equal edema of the lower extremities Neurologic: Normal speech and language.  No facial droop.  Moving all 4 extremities equally.  Sensation grossly intact.   Skin: Skin is warm, dry and intact. No rash noted.  Psychiatric: Mood and affect are normal. Speech and behavior are normal.  ED Results / Procedures / Treatments   Labs (all labs ordered are listed, but only abnormal results are displayed) Labs Reviewed  BLOOD GAS, VENOUS - Abnormal; Notable for the following components:      Result Value   pCO2, Ven 43 (*)    All other components within normal limits  COMPREHENSIVE METABOLIC PANEL - Abnormal; Notable for the following components:   Potassium 3.3 (*)    Glucose, Bld 120 (*)    BUN 6 (*)    Calcium 8.5 (*)    All other  components within normal limits  CBC WITH DIFFERENTIAL/PLATELET - Abnormal; Notable for the following components:   MCH 24.5 (*)    RDW 17.6 (*)    All other components within normal limits  D-DIMER, QUANTITATIVE - Abnormal; Notable for the following components:   D-Dimer, Quant 2.48 (*)    All other components within normal limits  SARS CORONAVIRUS 2 BY RT PCR  BRAIN NATRIURETIC PEPTIDE  CARBOXYHEMOGLOBIN - COOX  TROPONIN I (HIGH SENSITIVITY)    EKG EKG Interpretation  Date/Time:  Friday Feb 15 2023 09:36:42 EDT Ventricular Rate:  117 PR Interval:  160 QRS Duration: 103 QT Interval:  341 QTC Calculation: 476 R Axis:   -51 Text Interpretation: Sinus tachycardia Ventricular premature complex Right atrial enlargement LAD, consider left anterior fascicular block Consider right ventricular hypertrophy No significant changes since last tracing Confirmed by Vivien Rossetti ([id number]) on 02/15/2023 9:47:08 AM  Radiology CT Angio Chest PE W and/or Wo Contrast  Result Date: 02/15/2023 CLINICAL DATA:  Tachycardia with shortness of breath and elevated D-dimer. EXAM: CT ANGIOGRAPHY CHEST WITH CONTRAST TECHNIQUE: Multidetector CT imaging of the chest was performed using the standard protocol during bolus administration of intravenous contrast. Multiplanar CT image reconstructions and MIPs were obtained to evaluate the vascular anatomy. RADIATION DOSE REDUCTION: This exam was performed according to the departmental dose-optimization program which includes automated exposure control, adjustment of the mA and/or kV according to patient size and/or use of iterative reconstruction technique. CONTRAST:  75mL OMNIPAQUE IOHEXOL 350 MG/ML SOLN COMPARISON:  12/11/2022 FINDINGS: Cardiovascular: The heart size is normal. No substantial pericardial effusion. No thoracic aortic aneurysm. No substantial atherosclerosis of the thoracic aorta. There is no filling defect within the opacified pulmonary arteries to  suggest the presence of an acute pulmonary embolus. Mediastinum/Nodes: No mediastinal lymphadenopathy. There is no hilar lymphadenopathy. The esophagus has normal imaging features. Lungs/Pleura: Dependent atelectasis noted lower lungs bilaterally. Circumferential bronchial wall thickening noted with peripheral small airway impaction noted in the dependent lower lungs bilaterally. No focal consolidation or pulmonary edema. No pleural effusion. Upper Abdomen: Similar 2.7 cm hypoattenuating lesion in the central liver , likely a cyst. Additional tiny hypodense liver lesions are too small to characterize but likely benign. No followup imaging is recommended. Visualized upper abdomen otherwise unremarkable. Musculoskeletal: No worrisome lytic or sclerotic osseous abnormality. Review of the MIP images confirms the above findings. IMPRESSION: 1. No CT evidence for acute pulmonary embolus. 2. Circumferential bronchial wall thickening with peripheral small airway impaction in the dependent lower lungs bilaterally. Imaging features may be related to infectious/inflammatory bronchitis. Electronically Signed   By: Kennith Center M.D.   On: 02/15/2023 12:31   DG Chest Portable 1 View  Result Date: 02/15/2023 CLINICAL DATA:  Provided history: Dyspnea.  Recent bronchitis. EXAM: PORTABLE CHEST 1 VIEW COMPARISON:  Chest CT 12/11/2022. Prior chest radiographs 12/10/2022 and earlier. FINDINGS: A portion  of the left lateral costophrenic angle is excluded from the field of view. Within this limitation, findings are as follows. Borderline cardiomegaly. No appreciable airspace consolidation or pulmonary edema. No evidence of pleural effusion or pneumothorax. No acute osseous abnormality identified. IMPRESSION: 1. A portion of the left lateral costophrenic angle is excluded from the field of view. 2. Within this limitation, there is no evidence of an acute cardiopulmonary abnormality. 3. Borderline cardiomegaly. Electronically Signed    By: Jackey Loge D.O.   On: 02/15/2023 10:11    Procedures .Critical Care  Performed by: Mardene Sayer, MD Authorized by: Mardene Sayer, MD   Critical care provider statement:    Critical care time (minutes):  40   Critical care was necessary to treat or prevent imminent or life-threatening deterioration of the following conditions:  Cardiac failure and respiratory failure   Critical care was time spent personally by me on the following activities:  Development of treatment plan with patient or surrogate, evaluation of patient's response to treatment, examination of patient, ordering and review of laboratory studies, ordering and review of radiographic studies, ordering and performing treatments and interventions, pulse oximetry, re-evaluation of patient's condition, review of old charts and obtaining history from patient or surrogate   Care discussed with: admitting provider       Medications Ordered in ED Medications  sodium chloride 0.9 % bolus 500 mL (0 mLs Intravenous Stopped 02/15/23 1050)  ipratropium-albuterol (DUONEB) 0.5-2.5 (3) MG/3ML nebulizer solution 3 mL (3 mLs Nebulization Given 02/15/23 0957)  magnesium sulfate IVPB 2 g 50 mL (0 g Intravenous Stopped 02/15/23 1041)  ipratropium-albuterol (DUONEB) 0.5-2.5 (3) MG/3ML nebulizer solution 3 mL (3 mLs Nebulization Given 02/15/23 1019)  potassium chloride (KLOR-CON) packet 40 mEq (40 mEq Oral Given 02/15/23 1039)  doxycycline (VIBRA-TABS) tablet 100 mg (100 mg Oral Given 02/15/23 1039)  iohexol (OMNIPAQUE) 350 MG/ML injection 75 mL (75 mLs Intravenous Contrast Given 02/15/23 1153)  ipratropium-albuterol (DUONEB) 0.5-2.5 (3) MG/3ML nebulizer solution 3 mL (3 mLs Nebulization Given 02/15/23 1213)  labetalol (NORMODYNE) injection 10 mg (10 mg Intravenous Given 02/15/23 1242)  cefTRIAXone (ROCEPHIN) 1 g in sodium chloride 0.9 % 100 mL IVPB (1 g Intravenous New Bag/Given 02/15/23 1243)    ED Course/ Medical Decision Making/  A&P Clinical Course as of 02/15/23 1329  Fri Feb 15, 2023  1054 Patient has no hypoxia at rest however she does have mildly increased work of breathing however on ambulation with staff, patient reported to desaturate to 75% which required resting and sitting down to improve. [VB]  1120 X-ray personally reviewed by me of chest, no evidence of pulmonary edema or pneumonia.  Labs reviewed by me normal white blood cell count 6.9.  No anemia hemoglobin 12.2.  Creatinine 0.7 within normal limits.  Mild hypokalemia 3.3 ordered for oral repletion.  VBG with normal pH 7.37, no hypercarbia pCO2 43.  COVID-negative.  BMP unremarkable.  Troponin 4 and reassuring with no acute EKG changes no chest pain, no concern for ACS.  D-dimer elevated 2.48 thus ordered CTA PE study. [VB]    Clinical Course User Index [VB] Mardene Sayer, MD                             Medical Decision Making Mialynn Scarpa is a 62 y.o. female.  With PMH of HTN, DM recently admitted back in March 2024 for episode of bronchitis requiring steroids and breathing treatments who presents today  with increased work of breathing, wheezing and productive cough over the past 2 days.   Patient has mixed rhonchi with wheezing on exam with increased work of breathing but no active hypoxia at rest concerning for reactive airway disease secondary to possible viral URI, bacterial pneumonia among multiple other etiologies.  CHF however no signs of fluid overload on exam, previous echo with grade 1 diastolic dysfunction, chest x-ray with no pulmonary edema.  X-ray personally reviewed by me of chest, no evidence of pulmonary edema or pneumonia.  Labs reviewed by me normal white blood cell count 6.9.  No anemia hemoglobin 12.2.  Creatinine 0.7 within normal limits.  Mild hypokalemia 3.3 ordered for oral repletion.  VBG with normal pH 7.37, no hypercarbia pCO2 43.  COVID-negative.  BMP unremarkable.  Troponin 4 and reassuring with no acute EKG changes no  chest pain, no concern for ACS.  D-dimer elevated 2.48 thus ordered CTA PE study.  Negative for PE although findings consistent with bronchitis.  Covered with Rocephin and doxycycline.  Patient noted to be hypoxic upon ambulation dropping to mid 70s on room air.  Patient noted to have elevated blood pressures 204/96 however no findings concerning for hypertensive emergency.  Ordered for IV antihypertensives for hypertensive urgency.  Discussed with hospitalist for admission for blood pressure management control and reactive airway disease exacerbation management.   Amount and/or Complexity of Data Reviewed Labs: ordered. Radiology: ordered.  Risk Prescription drug management. Decision regarding hospitalization.     Final Clinical Impression(s) / ED Diagnoses Final diagnoses:  Bronchitis  Shortness of breath  Hypertensive urgency    Rx / DC Orders ED Discharge Orders     None         Mardene Sayer, MD 02/15/23 1329

## 2023-02-15 NOTE — ED Triage Notes (Signed)
BIB EMS from home c/o SOB worsened this morning. Dx with bronchitis last week. Finished all meds prescribed at that time.Tripoding on EMS arrival but saturations in the 90s. Attempted to reverse her on scene with 2 duonebs and 125 solumedrol.

## 2023-02-16 DIAGNOSIS — J40 Bronchitis, not specified as acute or chronic: Secondary | ICD-10-CM | POA: Diagnosis not present

## 2023-02-16 DIAGNOSIS — J219 Acute bronchiolitis, unspecified: Secondary | ICD-10-CM | POA: Diagnosis present

## 2023-02-16 LAB — BASIC METABOLIC PANEL
Anion gap: 9 (ref 5–15)
BUN: 13 mg/dL (ref 8–23)
CO2: 21 mmol/L — ABNORMAL LOW (ref 22–32)
Calcium: 8.9 mg/dL (ref 8.9–10.3)
Chloride: 106 mmol/L (ref 98–111)
Creatinine, Ser: 0.61 mg/dL (ref 0.44–1.00)
GFR, Estimated: >60 mL/min (ref >60)
Glucose, Bld: 143 mg/dL — ABNORMAL HIGH (ref 70–99)
Potassium: 3.9 mmol/L (ref 3.5–5.1)
Sodium: 136 mmol/L (ref 135–145)

## 2023-02-16 LAB — RESPIRATORY PANEL BY PCR

## 2023-02-16 LAB — CBC
HCT: 35.6 % — ABNORMAL LOW (ref 36.0–46.0)
Hemoglobin: 10.9 g/dL — ABNORMAL LOW (ref 12.0–15.0)
MCH: 24.3 pg — ABNORMAL LOW (ref 26.0–34.0)
MCHC: 30.6 g/dL (ref 30.0–36.0)
MCV: 79.3 fL — ABNORMAL LOW (ref 80.0–100.0)
Platelets: 293 10*3/uL (ref 150–400)
RBC: 4.49 MIL/uL (ref 3.87–5.11)
RDW: 17.3 % — ABNORMAL HIGH (ref 11.5–15.5)
WBC: 8.9 10*3/uL (ref 4.0–10.5)
nRBC: 0 % (ref 0.0–0.2)

## 2023-02-16 MED ORDER — DOXYCYCLINE HYCLATE 100 MG PO CAPS: 100.0000 mg | ORAL_CAPSULE | Freq: Two times a day (BID) | ORAL | 0 refills | Status: AC

## 2023-02-16 MED ORDER — PREDNISONE 10 MG PO TABS: 10.0000 mg | ORAL_TABLET | Freq: Every day | ORAL | Status: DC

## 2023-02-16 MED ORDER — BUDESONIDE 0.25 MG/2ML IN SUSP
0.2500 mg | Freq: Two times a day (BID) | RESPIRATORY_TRACT | Status: DC
  Administered 2023-02-16: 0.25 mg via RESPIRATORY_TRACT
  Filled 2023-02-16: qty 2

## 2023-02-16 MED ORDER — PREDNISONE 20 MG PO TABS: 40.0000 mg | ORAL_TABLET | Freq: Every day | ORAL | Status: DC

## 2023-02-16 MED ORDER — METOPROLOL TARTRATE 25 MG PO TABS: 25.0000 mg | ORAL_TABLET | Freq: Two times a day (BID) | ORAL | 0 refills | Status: DC

## 2023-02-16 MED ORDER — PREDNISONE 10 MG PO TABS: ORAL_TABLET | ORAL | 0 refills | Status: AC

## 2023-02-16 MED ORDER — AZITHROMYCIN 250 MG PO TABS: 500.0000 mg | ORAL_TABLET | Freq: Every day | ORAL | Status: DC

## 2023-02-16 NOTE — Discharge Instructions (Signed)
Follow with Suezanne Jacquet, Vickie L, NP-C in 5-7 days  Please get a complete blood count and chemistry panel checked by your Primary MD at your next visit, and again as instructed by your Primary MD. Please get your medications reviewed and adjusted by your Primary MD.  Please request your Primary MD to go over all Hospital Tests and Procedure/Radiological results at the follow up, please get all Hospital records sent to your Prim MD by signing hospital release before you go home.  In some cases, there will be blood work, cultures and biopsy results pending at the time of your discharge. Please request that your primary care M.D. goes through all the records of your hospital data and follows up on these results.  If you had Pneumonia of Lung problems at the Hospital: Please get a 2 view Chest X ray done in 6-8 weeks after hospital discharge or sooner if instructed by your Primary MD.  If you have Congestive Heart Failure: Please call your Cardiologist or Primary MD anytime you have any of the following symptoms:  1) 3 pound weight gain in 24 hours or 5 pounds in 1 week  2) shortness of breath, with or without a dry hacking cough  3) swelling in the hands, feet or stomach  4) if you have to sleep on extra pillows at night in order to breathe  Follow cardiac low salt diet and 1.5 lit/day fluid restriction.  If you have diabetes Accuchecks 4 times/day, Once in AM empty stomach and then before each meal. Log in all results and show them to your primary doctor at your next visit. If any glucose reading is under 80 or above 300 call your primary MD immediately.  If you have Seizure/Convulsions/Epilepsy: Please do not drive, operate heavy machinery, participate in activities at heights or participate in high speed sports until you have seen by Primary MD or a Neurologist and advised to do so again. Per [facility] statutes, patients with seizures are not allowed to drive until they have been  seizure-free for six months.  Use caution when using heavy equipment or power tools. Avoid working on ladders or at heights. Take showers instead of baths. Ensure the water temperature is not too high on the home water heater. Do not go swimming alone. Do not lock yourself in a room alone (i.e. bathroom). When caring for infants or small children, sit down when holding, feeding, or changing them to minimize risk of injury to the child in the event you have a seizure. Maintain good sleep hygiene. Avoid alcohol.   If you had Gastrointestinal Bleeding: Please ask your Primary MD to check a complete blood count within one week of discharge or at your next visit. Your endoscopic/colonoscopic biopsies that are pending at the time of discharge, will also need to followed by your Primary MD.  Get Medicines reviewed and adjusted. Please take all your medications with you for your next visit with your Primary MD  Please request your Primary MD to go over all hospital tests and procedure/radiological results at the follow up, please ask your Primary MD to get all Hospital records sent to his/her office.  If you experience worsening of your admission symptoms, develop shortness of breath, life threatening emergency, suicidal or homicidal thoughts you must seek medical attention immediately by calling 911 or calling your MD immediately  if symptoms less severe.  You must read complete instructions/literature along with all the possible adverse reactions/side effects for all the Medicines you  take and that have been prescribed to you. Take any new Medicines after you have completely understood and accpet all the possible adverse reactions/side effects.   Do not drive or operate heavy machinery when taking Pain medications.   Do not take more than prescribed Pain, Sleep and Anxiety Medications  Special Instructions: If you have smoked or chewed Tobacco  in the last 2 yrs please stop smoking, stop any regular  Alcohol  and or any Recreational drug use.  Wear Seat belts while driving.  Please note You were cared for by a hospitalist during your hospital stay. If you have any questions about your discharge medications or the care you received while you were in the hospital after you are discharged, you can call the unit and asked to speak with the hospitalist on call if the hospitalist that took care of you is not available. Once you are discharged, your primary care physician will handle any further medical issues. Please note that NO REFILLS for any discharge medications will be authorized once you are discharged, as it is imperative that you return to your primary care physician (or establish a relationship with a primary care physician if you do not have one) for your aftercare needs so that they can reassess your need for medications and monitor your lab values.  You can reach the hospitalist office at phone [phone] or fax [phone]   If you do not have a primary care physician, you can call [phone] for a physician referral.  Activity: As tolerated with Full fall precautions use walker/cane & assistance as needed    Diet: regular  Disposition Home

## 2023-02-16 NOTE — Progress Notes (Signed)
PT eating lunch at 1145- RT will attempt to revisit PT for scheduled therapy.

## 2023-02-16 NOTE — Plan of Care (Signed)

## 2023-02-16 NOTE — Plan of Care (Signed)
  Problem: Education: Goal: Knowledge of General Education information will improve Description Including pain rating scale, medication(s)/side effects and non-pharmacologic comfort measures Outcome: Progressing   Problem: Health Behavior/Discharge Planning: Goal: Ability to manage health-related needs will improve Outcome: Progressing   Problem: Clinical Measurements: Goal: Ability to maintain clinical measurements within normal limits will improve Outcome: Progressing Goal: Diagnostic test results will improve Outcome: Progressing Goal: Respiratory complications will improve Outcome: Progressing Goal: Cardiovascular complication will be avoided Outcome: Progressing   Problem: Activity: Goal: Risk for activity intolerance will decrease Outcome: Progressing   Problem: Nutrition: Goal: Adequate nutrition will be maintained Outcome: Progressing   Problem: Coping: Goal: Level of anxiety will decrease Outcome: Progressing   Problem: Pain Managment: Goal: General experience of comfort will improve Outcome: Progressing   Problem: Safety: Goal: Ability to remain free from injury will improve Outcome: Progressing   Problem: Skin Integrity: Goal: Risk for impaired skin integrity will decrease Outcome: Progressing   

## 2023-02-16 NOTE — Progress Notes (Signed)
RN received report on patient from off-going RN. RN agree with patient's most current assessment. RN will resume patient's care for the rest of the shift. RN will monitor for any changes.

## 2023-02-16 NOTE — Progress Notes (Signed)
Patient received discharge orders to go home. Patient was given discharge paperwork/instructions. RN went over discharge paperwork/instructions with the patient and patient's family. All questions/concerns were addressed and answered. Patient left the hospital stable, had discharge paperwork/instructions, and had all personal belongings.

## 2023-02-16 NOTE — Discharge Summary (Signed)
Physician Discharge Summary  Tracey Blackburn UEA:[id number] DOB: January 04, 1961 DOA: 02/15/2023  PCP: Avanell Shackleton, NP-C  Admit date: 02/15/2023 Discharge date: 02/16/2023  Admitted From: home Disposition:  home  Recommendations for Outpatient Follow-up:  Follow up with PCP in 1-2 weeks Recommend outpatient PFTs once acute episode resolves  Home Health: none Equipment/Devices: none  Discharge Condition: stable CODE STATUS: Full code Diet Orders (From admission, onward)     Start     Ordered   02/15/23 1404  Diet regular Room service appropriate? Yes; Fluid consistency: Thin  Diet effective now       Question Answer Comment  Room service appropriate? Yes   Fluid consistency: Thin      02/15/23 1404            HPI: Per admitting MD, Tracey Blackburn is a 62 y.o. female with medical history significant for obesity, hypertension recent admission March 2024 for bronchitis being admitted to the hospital with cough, shortness of breath, acute hypoxic respiratory failure due to recurrent bronchitis.  The patient states after being discharged from the hospital in March, she has been doing well, she completed a course of oral prednisone as an outpatient, has been continuing to take Symbicort twice daily as well as metoprolol which was prescribed for hypertension.  She was doing well until she woke up early this morning about 3 AM feeling short of breath, she then started to cough which was productive of thick yellow sputum.  Denies any, chills, chest pain, nausea.  She had diarrhea last time she was here, but she is not currently having any GI symptoms. She called EMS this morning, who gave her breathing treatments and steroid injection.  Hospital Course / Discharge diagnoses: Principal problem Acute bronchitis, acute hypoxic respiratory failure -patient was admitted to the hospital with bronchitis as evidenced by circumferential bronchial wall thickening in the peripheral small airway in  the dependent lower lungs bilaterally, concerning for infectious versus inflammatory bronchitis.  She was desatting when ambulating on room air.  She received steroids, antibiotics, nebulizers with significant improvement in her respiratory status.  Her wheezing resolved, her respiratory status has returned to baseline and she is able to ambulate in the hallway several 100 feet without any shortness of breath or other issues.  She will be discharged home in stable condition to complete another 5 days of antibiotics as well as on a steroid taper.  She already has inhalers.  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 What changed: how much to take   B-12 PO Take 1 tablet by mouth daily after breakfast.   benzonatate 100 MG capsule Commonly known as: TESSALON Take 100 mg by mouth every 8 (eight) hours as needed for cough.   Breyna 80-4.5 MCG/ACT inhaler Generic drug: budesonide-formoterol Inhale 2 puffs into the lungs 2 (two) times daily. What changed: Another medication with the same name was removed. Continue taking this medication, and follow  the directions you see here.   doxycycline 100 MG capsule Commonly known as: VIBRAMYCIN Take 1 capsule (100 mg total) by mouth 2 (two) times daily for 5 days.   ferrous sulfate 325 (65 FE) MG EC tablet Take 1 tablet (325 mg total) by mouth daily with breakfast. What changed: when to take  this   guaiFENesin 600 MG 12 hr tablet Commonly known as: MUCINEX Take 2 tablets (1,200 mg total) by mouth 2 (two) times daily. What changed:  how much to take when to take this   ipratropium 17 MCG/ACT inhaler Commonly known as: ATROVENT HFA Inhale 1 puff into the lungs 3 (three) times daily.   metoprolol tartrate 25 MG tablet Commonly known as: LOPRESSOR Take 1 tablet (25 mg total) by mouth 2 (two) times daily.   predniSONE 10 MG tablet Commonly known as: DELTASONE Take 4 tablets (40 mg total) by mouth daily with breakfast for 2 days, THEN 3 tablets (30 mg total) daily with breakfast for 2 days, THEN 2 tablets (20 mg total) daily with breakfast for 2 days, THEN 1 tablet (10 mg total) daily with breakfast for 2 days. Start taking on: Feb 16, 2023   Vitamin D (Ergocalciferol) 1.25 MG (50000 UNIT) Caps capsule Commonly known as: DRISDOL Take 1 capsule (50,000 Units total) by mouth every 7 (seven) days.        Follow-up Information     Henson, Vickie L, NP-C Follow up in 1 week(s).   Specialty: Family Medicine Contact information: [address] [place] [address] [phone]                Consultations: none  Procedures/Studies:  CT Angio Chest PE W and/or Wo Contrast  Result Date: 02/15/2023 CLINICAL DATA:  Tachycardia with shortness of breath and elevated D-dimer. EXAM: CT ANGIOGRAPHY CHEST WITH CONTRAST TECHNIQUE: Multidetector CT imaging of the chest was performed using the standard protocol during bolus administration of intravenous contrast. Multiplanar CT image reconstructions and MIPs were obtained to evaluate the vascular anatomy. RADIATION DOSE REDUCTION: This exam was performed according to the departmental dose-optimization program which includes automated exposure control, adjustment of the mA and/or kV according to patient size and/or use of iterative reconstruction technique. CONTRAST:  75mL OMNIPAQUE IOHEXOL 350 MG/ML SOLN COMPARISON:   12/11/2022 FINDINGS: Cardiovascular: The heart size is normal. No substantial pericardial effusion. No thoracic aortic aneurysm. No substantial atherosclerosis of the thoracic aorta. There is no filling defect within the opacified pulmonary arteries to suggest the presence of an acute pulmonary embolus. Mediastinum/Nodes: No mediastinal lymphadenopathy. There is no hilar lymphadenopathy. The esophagus has normal imaging features. Lungs/Pleura: Dependent atelectasis noted lower lungs bilaterally. Circumferential bronchial wall thickening noted with peripheral small airway impaction noted in the dependent lower lungs bilaterally. No focal consolidation or pulmonary edema. No pleural effusion. Upper Abdomen: Similar 2.7 cm hypoattenuating lesion in the central liver , likely a cyst. Additional tiny hypodense liver lesions are too small to characterize but likely benign. No followup imaging is recommended. Visualized upper abdomen otherwise unremarkable. Musculoskeletal: No worrisome lytic or sclerotic osseous abnormality. Review of the MIP images confirms the above findings. IMPRESSION: 1. No CT evidence for acute pulmonary embolus. 2. Circumferential bronchial wall thickening with peripheral small airway impaction in the dependent lower lungs bilaterally. Imaging features may be related to infectious/inflammatory bronchitis. Electronically Signed   By: Kennith Center M.D.   On: 02/15/2023 12:31   DG Chest Portable 1 View  Result Date: 02/15/2023 CLINICAL DATA:  Provided  history: Dyspnea.  Recent bronchitis. EXAM: PORTABLE CHEST 1 VIEW COMPARISON:  Chest CT 12/11/2022. Prior chest radiographs 12/10/2022 and earlier. FINDINGS: A portion of the left lateral costophrenic angle is excluded from the field of view. Within this limitation, findings are as follows. Borderline cardiomegaly. No appreciable airspace consolidation or pulmonary edema. No evidence of pleural effusion or pneumothorax. No acute osseous abnormality  identified. IMPRESSION: 1. A portion of the left lateral costophrenic angle is excluded from the field of view. 2. Within this limitation, there is no evidence of an acute cardiopulmonary abnormality. 3. Borderline cardiomegaly. Electronically Signed   By: Jackey Loge D.O.   On: 02/15/2023 10:11     Subjective: - no chest pain, shortness of breath, no abdominal pain, nausea or vomiting.   Discharge Exam: BP 139/74 (BP Location: Right Arm)   Pulse (!) 106   Temp 98.1 F (36.7 C) (Oral)   Resp 17   Ht 5\' 2"  (1.575 m)   Wt 95.1 kg   SpO2 99%   BMI 38.35 kg/m   General: Pt is alert, awake, not in acute distress Cardiovascular: RRR, S1/S2 +, no rubs, no gallops Respiratory: CTA bilaterally, no wheezing, no rhonchi Abdominal: Soft, NT, ND, bowel sounds + Extremities: no edema, no cyanosis   The results of significant diagnostics from this hospitalization (including imaging, microbiology, ancillary and laboratory) are listed below for reference.     Microbiology: Recent Results (from the past 240 hour(s))  SARS Coronavirus 2 by RT PCR (hospital order, performed in [facility] hospital lab) *cepheid single result test* Anterior Nasal Swab     Status: None   Collection Time: 02/15/23  9:25 AM   Specimen: Anterior Nasal Swab  Result Value Ref Range Status   SARS Coronavirus 2 by RT PCR NEGATIVE NEGATIVE Final    Comment: (NOTE) SARS-CoV-2 target nucleic acids are NOT DETECTED.  The SARS-CoV-2 RNA is generally detectable in upper and lower respiratory specimens during the acute phase of infection. The lowest concentration of SARS-CoV-2 viral copies this assay can detect is 250 copies / mL. A negative result does not preclude SARS-CoV-2 infection and should not be used as the sole basis for treatment or other patient management decisions.  A negative result may occur with improper specimen collection / handling, submission of specimen other than nasopharyngeal swab, presence of  viral mutation(s) within the areas targeted by this assay, and inadequate number of viral copies (<250 copies / mL). A negative result must be combined with clinical observations, patient history, and epidemiological information.  Fact Sheet for Patients:   RoadLapTop.co.za  Fact Sheet for Healthcare Providers: http://kim-miller.com/  This test is not yet approved or  cleared by the [place] FDA and has been authorized for detection and/or diagnosis of SARS-CoV-2 by FDA under an Emergency Use Authorization (EUA).  This EUA will remain in effect (meaning this test can be used) for the duration of the COVID-19 declaration under Section 564(b)(1) of the Act, 21 U.S.C. section 360bbb-3(b)(1), unless the authorization is terminated or revoked sooner.  Performed at [facility], [address] [place], [place] [address]   Respiratory (~20 pathogens) panel by PCR     Status: None   Collection Time: 02/16/23  7:27 AM   Specimen: Nasopharyngeal Swab; Respiratory  Result Value Ref Range Status   Adenovirus NOT DETECTED NOT DETECTED Final   Coronavirus 229E NOT DETECTED NOT DETECTED Final    Comment: (NOTE) The Coronavirus on the Respiratory Panel, DOES NOT test  for the novel  Coronavirus (2019 nCoV)    Coronavirus HKU1 NOT DETECTED NOT DETECTED Final   Coronavirus NL63 NOT DETECTED NOT DETECTED Final   Coronavirus OC43 NOT DETECTED NOT DETECTED Final   Metapneumovirus NOT DETECTED NOT DETECTED Final   Rhinovirus / Enterovirus NOT DETECTED NOT DETECTED Final   Influenza A NOT DETECTED NOT DETECTED Final   Influenza B NOT DETECTED NOT DETECTED Final   Parainfluenza Virus 1 NOT DETECTED NOT DETECTED Final   Parainfluenza Virus 2 NOT DETECTED NOT DETECTED Final   Parainfluenza Virus 3 NOT DETECTED NOT DETECTED Final   Parainfluenza Virus 4 NOT DETECTED NOT DETECTED Final   Respiratory Syncytial Virus NOT DETECTED NOT  DETECTED Final   Bordetella pertussis NOT DETECTED NOT DETECTED Final   Bordetella Parapertussis NOT DETECTED NOT DETECTED Final   Chlamydophila pneumoniae NOT DETECTED NOT DETECTED Final   Mycoplasma pneumoniae NOT DETECTED NOT DETECTED Final    Comment: Performed at [facility], [address], [place], [place] [address]     Labs: Basic Metabolic Panel: Recent Labs  Lab 02/15/23 0925 02/16/23 0411  NA 136 136  K 3.3* 3.9  CL 104 106  CO2 23 21*  GLUCOSE 120* 143*  BUN 6* 13  CREATININE 0.70 0.61  CALCIUM 8.5* 8.9   Liver Function Tests: Recent Labs  Lab 02/15/23 0925  AST 19  ALT 12  ALKPHOS 51  BILITOT 0.6  PROT 7.9  ALBUMIN 3.9   CBC: Recent Labs  Lab 02/15/23 0925 02/16/23 0411  WBC 6.9 8.9  NEUTROABS 4.1  --   HGB 12.2 10.9*  HCT 40.0 35.6*  MCV 80.3 79.3*  PLT 280 293   CBG: No results for input(s): "GLUCAP" in the last 168 hours. Hgb A1c No results for input(s): "HGBA1C" in the last 72 hours. Lipid Profile No results for input(s): "CHOL", "HDL", "LDLCALC", "TRIG", "CHOLHDL", "LDLDIRECT" in the last 72 hours. Thyroid function studies No results for input(s): "TSH", "T4TOTAL", "T3FREE", "THYROIDAB" in the last 72 hours.  Invalid input(s): "FREET3" Urinalysis    Component Value Date/Time   COLORURINE YELLOW 12/11/2022 0322   APPEARANCEUR CLEAR 12/11/2022 0322   LABSPEC <=1.005 12/11/2022 0322   PHURINE 5.0 12/11/2022 0322   GLUCOSEU NEGATIVE 12/11/2022 0322   HGBUR SMALL (A) 12/11/2022 0322   BILIRUBINUR NEGATIVE 12/11/2022 0322   KETONESUR 5 (A) 12/11/2022 0322   PROTEINUR NEGATIVE 12/11/2022 0322   NITRITE NEGATIVE 12/11/2022 0322   LEUKOCYTESUR NEGATIVE 12/11/2022 0322    FURTHER DISCHARGE INSTRUCTIONS:   Get Medicines reviewed and adjusted: Please take all your medications with you for your next visit with your Primary MD   Laboratory/radiological data: Please request your Primary MD to go over all hospital tests and  procedure/radiological results at the follow up, please ask your Primary MD to get all Hospital records sent to his/her office.   In some cases, they will be blood work, cultures and biopsy results pending at the time of your discharge. Please request that your primary care M.D. goes through all the records of your hospital data and follows up on these results.   Also Note the following: If you experience worsening of your admission symptoms, develop shortness of breath, life threatening emergency, suicidal or homicidal thoughts you must seek medical attention immediately by calling 911 or calling your MD immediately  if symptoms less severe.   You must read complete instructions/literature along with all the possible adverse reactions/side effects for all the Medicines you take and  that have been prescribed to you. Take any new Medicines after you have completely understood and accpet all the possible adverse reactions/side effects.    Do not drive when taking Pain medications or sleeping medications (Benzodaizepines)   Do not take more than prescribed Pain, Sleep and Anxiety Medications. It is not advisable to combine anxiety,sleep and pain medications without talking with your primary care practitioner   Special Instructions: If you have smoked or chewed Tobacco  in the last 2 yrs please stop smoking, stop any regular Alcohol  and or any Recreational drug use.   Wear Seat belts while driving.   Please note: You were cared for by a hospitalist during your hospital stay. Once you are discharged, your primary care physician will handle any further medical issues. Please note that NO REFILLS for any discharge medications will be authorized once you are discharged, as it is imperative that you return to your primary care physician (or establish a relationship with a primary care physician if you do not have one) for your post hospital discharge needs so that they can reassess your need for  medications and monitor your lab values.  Time coordinating discharge: 35 minutes  SIGNED:  Pamella Pert, MD, PhD 02/16/2023, 3:27 PM

## 2023-02-20 ENCOUNTER — Telehealth: Payer: Self-pay | Admitting: Gastroenterology

## 2023-02-20 NOTE — Telephone Encounter (Signed)
Good afternoon Dr. Barron Alvine  The following patient was referred to [place] for Ulcerative pancolitis with rectal bleeding (HCC), Fatty liver and Anemia. She had an EGD/colonoscopy done on 3/22 by Dr. Elnoria Howard. She is wanting to establish care because she doesn't have an actual GI doctor and wants to get the help needed. Records are available in Epic. Please review and advise of scheduling

## 2023-02-22 ENCOUNTER — Ambulatory Visit: Payer: BC Managed Care – PPO | Admitting: Family Medicine

## 2023-03-06 ENCOUNTER — Encounter: Payer: BLUE CROSS/BLUE SHIELD | Admitting: Medical

## 2023-03-20 ENCOUNTER — Encounter: Payer: Self-pay | Admitting: Internal Medicine

## 2023-03-20 ENCOUNTER — Ambulatory Visit: Payer: BC Managed Care – PPO | Admitting: Internal Medicine

## 2023-03-20 VITALS — BP 134/80 | HR 90 | Temp 98.3°F | Ht 62.0 in | Wt 209.6 lb

## 2023-03-20 DIAGNOSIS — K219 Gastro-esophageal reflux disease without esophagitis: Secondary | ICD-10-CM

## 2023-03-20 DIAGNOSIS — J4540 Moderate persistent asthma, uncomplicated: Secondary | ICD-10-CM | POA: Diagnosis not present

## 2023-03-20 LAB — POCT EXHALED NITRIC OXIDE: FeNO level (ppb): 78

## 2023-03-20 MED ORDER — DULERA 200-5 MCG/ACT IN AERO: 2.0000 | INHALATION_SPRAY | Freq: Two times a day (BID) | RESPIRATORY_TRACT | 5 refills | Status: DC

## 2023-03-20 MED ORDER — OMEPRAZOLE 40 MG PO CPDR: 40.0000 mg | DELAYED_RELEASE_CAPSULE | Freq: Every day | ORAL | 5 refills | Status: DC

## 2023-03-20 NOTE — Patient Instructions (Addendum)
Please schedule follow up scheduled with myself in 3 months.  If my schedule is not open yet, we will contact you with a reminder closer to that time. Please call [phone] if you haven't heard from [place] a month before.   Before your next visit I would like you to have:  Breathing testing - Spirometry at next visit.  I think your symptoms are related to asthma. But you also have reflux that is irritating the throat and causing cough.  Start taking omeprazole 40 mg once daily- this is also available over the counter if that's cheaper for you.   Stop the breyna inhaler. Start Dulera 200 2 puffs twice daily with spacer, gargle after use.   Continue the albuterol inhaler with spacer as needed.

## 2023-03-20 NOTE — Progress Notes (Signed)
Tracey Blackburn    [id number]    [birth year]  Primary Care Physician:Henson, Zorita Pang, NP-C  Referring Physician: Avanell Shackleton, NP-C [address] [place],  [place] [address] Reason for Consultation: shortness of breath Date of Consultation: 03/20/2023  Chief complaint:   Chief Complaint  Patient presents with   Consult    Cough, wheezing, SOB since March      HPI: Jeana Ketchem is a 62 y.o. woman who presents for new patient evaluation of wheezing, shortness of breath. Symptoms started end of March. When she had fevers, chills, shortness of breath. Was admitted to [facility] for about a week.treated with steroids, bronchodilators. Also had diagnosis of pancolitis and seen by GI and started on steroids. She is supposed to be on prednisone 10 mg twice daily for colitis but ran out.  No recurrence of blood diarrhea. Daughter is trying to get her back in to see GI - Dr Mann/Hung.   She went home improved on March 25th and then symptoms worsened again in May.  Daughter says heat was a trigger for symptoms worsening in May 2024.  Daughter has evaluated her home for carbon monoxide.   She was prescribed breyna (budesonide-fomoterol) and albuterol 2-3 times/day.    No childhood respiratory disease. She  She does have some seasonal allergic rhinitis.    She denies over reflux/heart burn.   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  Exposures: lives at home, no pets. Been living in her home for 3 years, there is carpet in the bedroom.  Smoking history: never smoker, passive smoke exposure in childhood.   Social History   Occupational History   Not on file  Tobacco Use   Smoking status: Never   Smokeless tobacco: Never  Vaping Use   Vaping Use: Never used  Substance and Sexual Activity   Alcohol use: No    Alcohol/week: 0.0 standard drinks of alcohol   Drug use: No   Sexual activity: Not Currently    Relevant  family history:  Family History  Problem Relation Age of Onset   Diabetes Other    Hypertension Other    Asthma Grandchild     Past Medical History:  Diagnosis Date   Diabetes mellitus without complication (HCC)    Hypertension     Past Surgical History:  Procedure Laterality Date   BIOPSY  12/14/2022   Procedure: BIOPSY;  Surgeon: Jeani Hawking, MD;  Location: [facility];  Service: Gastroenterology;;   CESAREAN SECTION     COLONOSCOPY N/A 12/14/2022   Procedure: COLONOSCOPY;  Surgeon: Jeani Hawking, MD;  Location: [facility];  Service: Gastroenterology;  Laterality: N/A;   ESOPHAGOGASTRODUODENOSCOPY N/A 12/14/2022   Procedure: ESOPHAGOGASTRODUODENOSCOPY (EGD);  Surgeon: Jeani Hawking, MD;  Location: [facility];  Service: Gastroenterology;  Laterality: N/A;     Physical Exam: Blood pressure 134/80, pulse 90, temperature 98.3 F (36.8 C), temperature source Oral, height 5\' 2"  (1.575 m), weight 209 lb 9.6 oz (95.1 kg), SpO2 99 %. Gen:      No acute distress, obese ENT:  mallampati IV, no nasal polyps, mucus membranes moist Lungs:    No increased respiratory effort, symmetric chest wall excursion, clear to auscultation bilaterally, wheezing best auscultated over the anterior neck  CV:         Regular rate and rhythm; no murmurs, rubs, or gallops.  No pedal edema Abd:      + bowel sounds;  soft, non-tender; no distension MSK: no acute synovitis of DIP or PIP joints, no mechanics hands.  Skin:      Warm and dry; no rashes Neuro: normal speech, no focal facial asymmetry Psych: alert and oriented x3, normal mood and affect   Data Reviewed/Medical Decision Making:  Independent interpretation of tests: Imaging:  Review of patient's CTPE study images May 2024 revealed no PE, chronic bronchitis, patulous esophagus. The patient's images have been independently reviewed by me.    PFTs: I have personally reviewed the patient's PFTs and      No data to display           Labs:  Lab Results  Component Value Date   NA 136 02/16/2023   K 3.9 02/16/2023   CO2 21 (L) 02/16/2023   GLUCOSE 143 (H) 02/16/2023   BUN 13 02/16/2023   CREATININE 0.61 02/16/2023   CALCIUM 8.9 02/16/2023   GFR 94.64 02/07/2023   EGFR 100.0 06/06/2022   GFRNONAA >60 02/16/2023   Lab Results  Component Value Date   WBC 8.9 02/16/2023   HGB 10.9 (L) 02/16/2023   HCT 35.6 (L) 02/16/2023   MCV 79.3 (L) 02/16/2023   PLT 293 02/16/2023     Immunization status:   There is no immunization history on file for this patient.   I reviewed prior external note(s) from hospital stay, ed  I reviewed the result(s) of the labs and imaging as noted above.   I have ordered feno   Assessment:  Moderate persistent asthma, not well controlled Chronic cough with patulous esophagus - GERD   Plan/Recommendations:  Feno 78 ppb  Breathing testing - Spirometry at next visit.  I think your symptoms are related to asthma. But you also have reflux that is irritating the throat and causing cough.  Start taking omeprazole 40 mg once daily- this is also available over the counter if that's cheaper for you.   Stop the breyna inhaler. Start Dulera 200 2 puffs twice daily with spacer, gargle after use.   Continue the albuterol inhaler with spacer as needed.   We discussed disease management and progression at length today.     Return to Care: Return in about 3 months (around 06/20/2023).  Durel Salts, MD Pulmonary and Critical Care Medicine [facility] Office:[phone]  CC: Avanell Shackleton, NP-C

## 2023-03-20 NOTE — Telephone Encounter (Signed)
Good morning  The following PT is asking would you please consider letting her come in for an office visit for a second opinion? [facility] will not allow her to schedule until she paid off a debt. Please advise.  Thank you

## 2023-03-21 ENCOUNTER — Ambulatory Visit (INDEPENDENT_AMBULATORY_CARE_PROVIDER_SITE_OTHER): Payer: BC Managed Care – PPO | Admitting: Family Medicine

## 2023-03-21 ENCOUNTER — Encounter: Payer: Self-pay | Admitting: Family Medicine

## 2023-03-21 VITALS — BP 132/84 | HR 105 | Temp 97.6°F | Ht 62.0 in | Wt 210.0 lb

## 2023-03-21 DIAGNOSIS — Z09 Encounter for follow-up examination after completed treatment for conditions other than malignant neoplasm: Secondary | ICD-10-CM

## 2023-03-21 DIAGNOSIS — I10 Essential (primary) hypertension: Secondary | ICD-10-CM

## 2023-03-21 DIAGNOSIS — R0602 Shortness of breath: Secondary | ICD-10-CM

## 2023-03-21 DIAGNOSIS — R000 Tachycardia, unspecified: Secondary | ICD-10-CM | POA: Diagnosis not present

## 2023-03-21 MED ORDER — ATENOLOL 100 MG PO TABS
100.0000 mg | ORAL_TABLET | Freq: Every day | ORAL | 0 refills | Status: AC
Start: 2023-03-21 — End: ?

## 2023-03-21 NOTE — Progress Notes (Signed)
Subjective:     Patient ID: Tracey Blackburn, female    DOB: 01/25/1961, 62 y.o.   MRN: [id number]  Chief Complaint  Patient presents with   Medical Management of Chronic Issues    6 week f/u, pulmonology went yesterday, GI wont see her due to balance. BP ranging in the 120s/85-90 at home per patient, no logs w her.    HPI  Discussed the use of AI scribe software for clinical note transcription with the patient, who gave verbal consent to proceed.  History of Present Illness         Her daughter is with her today.  She is here to follow-up on elevated blood pressure at her previous visit.  Checking blood pressures at home and they have been in the 120/70-80 range.  She was started on metoprolol 25 mg twice daily for tachycardia. Reports good compliance with medications.  She has not taken her medications today. States she occasionally gets winded and feels her heart racing at work.  She has to rest and then her pulse is back to normal.  Under the care of pulmonology for presumed asthma.  Dulera prescribed.  Upcoming PFTs planned with pulmonologist.  Hospitalized 02/15/2023 to 02/16/2024 for shortness of breath.  Diagnosed with bronchitis. Negative for PE.  Iron deficient and is continuing on oral iron.   Health Maintenance Due  Topic Date Due   Hepatitis C Screening  Never done   DTaP/Tdap/Td (1 - Tdap) Never done   PAP SMEAR-Modifier  Never done   MAMMOGRAM  Never done   Zoster Vaccines- Shingrix (1 of 2) Never done    Past Medical History:  Diagnosis Date   Diabetes mellitus without complication (HCC)    Hypertension     Past Surgical History:  Procedure Laterality Date   BIOPSY  12/14/2022   Procedure: BIOPSY;  Surgeon: Jeani Hawking, MD;  Location: [facility];  Service: Gastroenterology;;   CESAREAN SECTION     COLONOSCOPY N/A 12/14/2022   Procedure: COLONOSCOPY;  Surgeon: Jeani Hawking, MD;  Location: [facility];  Service: Gastroenterology;  Laterality: N/A;    ESOPHAGOGASTRODUODENOSCOPY N/A 12/14/2022   Procedure: ESOPHAGOGASTRODUODENOSCOPY (EGD);  Surgeon: Jeani Hawking, MD;  Location: [facility];  Service: Gastroenterology;  Laterality: N/A;    Family History  Problem Relation Age of Onset   Diabetes Other    Hypertension Other    Asthma Grandchild     Social History   Socioeconomic History   Marital status: Divorced    Spouse name: Not on file   Number of children: Not on file   Years of education: Not on file   Highest education level: Not on file  Occupational History   Not on file  Tobacco Use   Smoking status: Never   Smokeless tobacco: Never  Vaping Use   Vaping Use: Never used  Substance and Sexual Activity   Alcohol use: No    Alcohol/week: 0.0 standard drinks of alcohol   Drug use: No   Sexual activity: Not Currently  Other Topics Concern   Not on file  Social History Narrative   Not on file   Social Determinants of Health   Financial Resource Strain: Not on file  Food Insecurity: No Food Insecurity (02/15/2023)   Hunger Vital Sign    Worried About Running Out of Food in the Last Year: Never true    Ran Out of Food in the Last Year: Never true  Recent Concern: Food Insecurity - Food Insecurity Present (12/11/2022)  Hunger Vital Sign    Worried About Running Out of Food in the Last Year: Sometimes true    Ran Out of Food in the Last Year: Sometimes true  Transportation Needs: No Transportation Needs (02/15/2023)   PRAPARE - Administrator, Civil Service (Medical): No    Lack of Transportation (Non-Medical): No  Physical Activity: Not on file  Stress: Not on file  Social Connections: Not on file  Intimate Partner Violence: Not At Risk (02/15/2023)   Humiliation, Afraid, Rape, and Kick questionnaire    Fear of Current or Ex-Partner: No    Emotionally Abused: No    Physically Abused: No    Sexually Abused: No    Outpatient Medications Prior to Visit  Medication Sig Dispense Refill    albuterol (VENTOLIN HFA) 108 (90 Base) MCG/ACT inhaler Inhale 1-2 puffs into the lungs every 6 (six) hours as needed for wheezing or shortness of breath. (Patient taking differently: Inhale 2 puffs into the lungs every 6 (six) hours as needed for wheezing or shortness of breath.) 1 each 0   benzonatate (TESSALON) 100 MG capsule Take 100 mg by mouth every 8 (eight) hours as needed for cough.     Cyanocobalamin (B-12 PO) Take 1 tablet by mouth daily after breakfast.     ferrous sulfate 325 (65 FE) MG EC tablet Take 1 tablet (325 mg total) by mouth daily with breakfast. (Patient taking differently: Take 325 mg by mouth 2 (two) times daily with a meal.) 60 tablet 3   guaiFENesin (MUCINEX) 600 MG 12 hr tablet Take 2 tablets (1,200 mg total) by mouth 2 (two) times daily. (Patient taking differently: Take 600 mg by mouth in the morning and at bedtime.) 30 tablet 0   mometasone-formoterol (DULERA) 200-5 MCG/ACT AERO Inhale 2 puffs into the lungs in the morning and at bedtime. 1 each 5   omeprazole (PRILOSEC) 40 MG capsule Take 1 capsule (40 mg total) by mouth daily. 30 capsule 5   Vitamin D, Ergocalciferol, (DRISDOL) 1.25 MG (50000 UNIT) CAPS capsule Take 1 capsule (50,000 Units total) by mouth every 7 (seven) days. 4 capsule 2   ipratropium (ATROVENT HFA) 17 MCG/ACT inhaler Inhale 1 puff into the lungs 3 (three) times daily. 1 each 2   metoprolol tartrate (LOPRESSOR) 25 MG tablet Take 1 tablet (25 mg total) by mouth 2 (two) times daily. 60 tablet 0   No facility-administered medications prior to visit.    No Known Allergies  Review of Systems  Constitutional:  Positive for malaise/fatigue. Negative for chills and fever.  Respiratory:  Positive for shortness of breath and wheezing.        DOE  Cardiovascular:  Negative for chest pain, palpitations, orthopnea, claudication and leg swelling.  Gastrointestinal:  Negative for abdominal pain, constipation, diarrhea, nausea and vomiting.  Genitourinary:   Negative for dysuria, frequency and urgency.  Neurological:  Negative for dizziness, sensory change, focal weakness and headaches.  Psychiatric/Behavioral:  Negative for depression. The patient is not nervous/anxious.        Objective:    Physical Exam Constitutional:      General: She is not in acute distress.    Appearance: She is not ill-appearing.  Eyes:     Extraocular Movements: Extraocular movements intact.     Conjunctiva/sclera: Conjunctivae normal.  Cardiovascular:     Rate and Rhythm: Regular rhythm. Tachycardia present.  Pulmonary:     Effort: Pulmonary effort is normal.     Breath sounds: Wheezing present.  Musculoskeletal:     Cervical back: Normal range of motion and neck supple.     Right lower leg: No edema.     Left lower leg: No edema.  Skin:    General: Skin is warm and dry.     Coloration: Skin is not pale.  Neurological:     General: No focal deficit present.     Mental Status: She is alert and oriented to person, place, and time.     Cranial Nerves: No cranial nerve deficit.     Sensory: No sensory deficit.     Motor: No weakness.     Coordination: Coordination normal.     Gait: Gait normal.  Psychiatric:        Mood and Affect: Mood normal.        Behavior: Behavior normal.        Thought Content: Thought content normal.      BP 132/84 (BP Location: Left Arm, Patient Position: Sitting, Cuff Size: Large)   Pulse (!) 105   Temp 97.6 F (36.4 C) (Temporal)   Ht 5\' 2"  (1.575 m)   Wt 210 lb (95.3 kg)   SpO2 97%   BMI 38.41 kg/m  Wt Readings from Last 3 Encounters:  03/21/23 210 lb (95.3 kg)  03/20/23 209 lb 9.6 oz (95.1 kg)  02/15/23 209 lb 10.5 oz (95.1 kg)       Assessment & Plan:   Problem List Items Addressed This Visit       Cardiovascular and Mediastinum   Essential hypertension - Primary   Relevant Medications   atenolol (TENORMIN) 100 MG tablet     Other   Dyspnea   Tachycardia   Relevant Medications   atenolol  (TENORMIN) 100 MG tablet   Other Visit Diagnoses     Hospital discharge follow-up          Reviewed hospital discharge summary and results from hospitalization in May 2024.  I also reviewed notes from her pulmonologist yesterday. Sinus tachycardia. HR in 120s with ambulation, no chest pain.  Switch metoprolol to atenolol.  Continue other medications.  Follow up as recommended with pulmonologist.    I have discontinued Tracey Blackburn's ipratropium and metoprolol tartrate. I am also having her start on atenolol. Additionally, I am having her maintain her guaiFENesin, albuterol, ferrous sulfate, Cyanocobalamin (B-12 PO), Vitamin D (Ergocalciferol), benzonatate, omeprazole, and Dulera.  Meds ordered this encounter  Medications   atenolol (TENORMIN) 100 MG tablet    Sig: Take 1 tablet (100 mg total) by mouth daily.    Dispense:  90 tablet    Refill:  0    Order Specific Question:   Supervising Provider    Answer:   Hillard Danker A [[id number]]

## 2023-03-21 NOTE — Patient Instructions (Signed)
Start atenolol 100 mg once daily and stop metoprolol.   Follow up in 3 months or sooner if any new or worsening symptoms.   Let me know if you have not heard back to schedule with [facility] in one week.

## 2023-04-11 ENCOUNTER — Ambulatory Visit (INDEPENDENT_AMBULATORY_CARE_PROVIDER_SITE_OTHER): Payer: BC Managed Care – PPO | Admitting: Family Medicine

## 2023-04-11 ENCOUNTER — Other Ambulatory Visit: Payer: Self-pay

## 2023-04-11 ENCOUNTER — Telehealth: Payer: Self-pay | Admitting: Family Medicine

## 2023-04-11 ENCOUNTER — Ambulatory Visit (INDEPENDENT_AMBULATORY_CARE_PROVIDER_SITE_OTHER): Payer: BC Managed Care – PPO

## 2023-04-11 VITALS — BP 128/82 | HR 71 | Ht 62.0 in | Wt 207.0 lb

## 2023-04-11 DIAGNOSIS — M1712 Unilateral primary osteoarthritis, left knee: Secondary | ICD-10-CM

## 2023-04-11 DIAGNOSIS — G8929 Other chronic pain: Secondary | ICD-10-CM | POA: Diagnosis not present

## 2023-04-11 DIAGNOSIS — M25462 Effusion, left knee: Secondary | ICD-10-CM | POA: Diagnosis not present

## 2023-04-11 DIAGNOSIS — M25562 Pain in left knee: Secondary | ICD-10-CM | POA: Diagnosis not present

## 2023-04-11 NOTE — Telephone Encounter (Signed)
Called and notified daughter of PCP advise, she verbalized understanding

## 2023-04-11 NOTE — Patient Instructions (Addendum)
Thank you for coming in today.   Please use Voltaren gel (Generic Diclofenac Gel) up to 4x daily for pain as needed.  This is available over-the-counter as both the name brand Voltaren gel and the generic diclofenac gel.   You received an injection today. Seek immediate medical attention if the joint becomes red, extremely painful, or is oozing fluid.   Please get an Xray today before you leave   Check back as needed

## 2023-04-11 NOTE — Telephone Encounter (Signed)
FYI.Marland KitchenMarland KitchenPt daughter stating pt can't see Gastro until further out.

## 2023-04-11 NOTE — Progress Notes (Signed)
Rubin Payor, PhD, LAT, ATC acting as a scribe for Tracey Graham, MD.  Tracey Blackburn is a 62 y.o. female who presents to [facility] at [facility] today for R knee pain ongoing since Tuesday. No injury. She's been seen previously for this issue in 2022 by Dr. Jordan Likes. Pt locates pain to the anterior aspect of her L knee.  R Knee swelling: yes Mechanical symptoms:no Aggravates: walking, WB Treatments tried: tylenol, icy hot  Pertinent review of systems: No fevers or chills  Relevant historical information: Hypertension.  Pancolitis   Exam:  BP 128/82   Pulse 71   Ht 5\' 2"  (1.575 m)   Wt 207 lb (93.9 kg)   SpO2 97%   BMI 37.86 kg/m  General: Well Developed, well nourished, and in no acute distress.   MSK: Left knee: Mild effusion.  Normal-appearing otherwise. Normal motion. Intact strength. Tender palpation medial joint line. Stable ligamentous exam.    Lab and Radiology Results  Procedure: Real-time Ultrasound Guided Injection of left knee joint superior lateral patella space Device: Philips Affiniti 50G/GE Logiq Images permanently stored and available for review in PACS Verbal informed consent obtained.  Discussed risks and benefits of procedure. Warned about infection, bleeding, hyperglycemia damage to structures among others. Patient expresses understanding and agreement Time-out conducted.   Noted no overlying erythema, induration, or other signs of local infection.   Skin prepped in a sterile fashion.   Local anesthesia: Topical Ethyl chloride.   With sterile technique and under real time ultrasound guidance: 40 mg of Kenalog and 2 mL of Marcaine injected into knee joint. Fluid seen entering the joint capsule.   Completed without difficulty   Pain immediately resolved suggesting accurate placement of the medication.   Advised to call if fevers/chills, erythema, induration, drainage, or persistent bleeding.   Images permanently stored and  available for review in the ultrasound unit.  Impression: Technically successful ultrasound guided injection.    X-ray images left knee obtained today personally and independently interpreted Mild medial DJD.  No acute fractures are visible. Await formal radiology review    Assessment and Plan: 62 y.o. female with left knee pain.  Concerning for degenerative medial meniscus tear or exacerbation of medial DJD.  Plan for steroid injection today.  If not improving consider physical therapy referral. This is the first time I seen her for this problem however she did see a different sports medicine provider for the same problem in 2022 and had good benefit with steroid injection.  PDMP not reviewed this encounter. Orders Placed This Encounter  Procedures   [place] LIMITED JOINT SPACE STRUCTURES LOW LEFT(NO LINKED CHARGES)    Order Specific Question:   Reason for Exam (SYMPTOM  OR DIAGNOSIS REQUIRED)    Answer:   left knee pain    Order Specific Question:   Preferred imaging location?    Answer:    [facility] Knee AP/LAT W/[place] Left    Standing Status:   Future    Number of Occurrences:   1    Standing Expiration Date:   05/12/2023    Order Specific Question:   Reason for Exam (SYMPTOM  OR DIAGNOSIS REQUIRED)    Answer:   left knee pain    Order Specific Question:   Preferred imaging location?    Answer:   [name]   No orders of the defined types were placed in this encounter.    Discussed warning signs or symptoms. Please  see discharge instructions. Patient expresses understanding.   The above documentation has been reviewed and is accurate and complete Tracey Blackburn, M.D.

## 2023-04-12 ENCOUNTER — Telehealth: Payer: Self-pay | Admitting: Family Medicine

## 2023-04-12 NOTE — Telephone Encounter (Addendum)
Patient's daughter called stating that the patient was seen yesterday by Dr Denyse Amass and had a cortisone injection in her right knee but she was still experiencing pain and trouble walking. Daughter spoke to [facility] who advised that it can take some time for it to begin working and recommended taking Tylenol and resting. Also advised patient to go to the ED if pain is worsening.   Just FYI.

## 2023-04-15 NOTE — Telephone Encounter (Signed)
Called pt, left VM to call the office.  

## 2023-04-22 NOTE — Progress Notes (Signed)
Left knee x-ray shows arthritis.

## 2023-05-02 ENCOUNTER — Ambulatory Visit: Payer: BC Managed Care – PPO | Admitting: Family Medicine

## 2023-05-09 ENCOUNTER — Encounter (INDEPENDENT_AMBULATORY_CARE_PROVIDER_SITE_OTHER): Payer: Self-pay

## 2023-06-03 ENCOUNTER — Other Ambulatory Visit: Payer: Self-pay

## 2023-06-03 DIAGNOSIS — J4540 Moderate persistent asthma, uncomplicated: Secondary | ICD-10-CM

## 2023-06-04 ENCOUNTER — Other Ambulatory Visit: Payer: Self-pay

## 2023-06-04 ENCOUNTER — Ambulatory Visit: Payer: BC Managed Care – PPO | Admitting: Internal Medicine

## 2023-06-04 DIAGNOSIS — J4540 Moderate persistent asthma, uncomplicated: Secondary | ICD-10-CM

## 2023-06-04 LAB — PULMONARY FUNCTION TEST
DL/VA % pred: 138 %
DL/VA: 5.91 ml/min/mmHg/L
DLCO cor % pred: 110 %
DLCO cor: 20.73 ml/min/mmHg
DLCO unc % pred: 110 %
DLCO unc: 20.73 ml/min/mmHg
FEF 25-75 Pre: 1.7 L/s
FEF2575-%Pred-Pre: 78 %
FEV1-%Pred-Pre: 76 %
FEV1-Pre: 1.77 L
FEV1FVC-%Pred-Pre: 105 %
FEV6-%Pred-Pre: 75 %
FEV6-Pre: 2.17 L
FEV6FVC-%Pred-Pre: 103 %
FVC-%Pred-Pre: 72 %
FVC-Pre: 2.17 L
Pre FEV1/FVC ratio: 82 %
Pre FEV6/FVC Ratio: 100 %

## 2023-06-04 NOTE — Patient Instructions (Signed)
Spiro/DLCO performed today. 

## 2023-06-04 NOTE — Progress Notes (Signed)
Spiro/DLCO performed today. 

## 2023-06-05 ENCOUNTER — Ambulatory Visit: Payer: BC Managed Care – PPO | Admitting: Internal Medicine

## 2023-06-05 ENCOUNTER — Encounter: Payer: Self-pay | Admitting: Internal Medicine

## 2023-06-05 VITALS — BP 110/70 | HR 98 | Ht 62.0 in | Wt 200.8 lb

## 2023-06-05 DIAGNOSIS — Z23 Encounter for immunization: Secondary | ICD-10-CM | POA: Diagnosis not present

## 2023-06-05 DIAGNOSIS — K219 Gastro-esophageal reflux disease without esophagitis: Secondary | ICD-10-CM

## 2023-06-05 DIAGNOSIS — J4540 Moderate persistent asthma, uncomplicated: Secondary | ICD-10-CM

## 2023-06-05 MED ORDER — DULERA 200-5 MCG/ACT IN AERO: 2.0000 | INHALATION_SPRAY | Freq: Two times a day (BID) | RESPIRATORY_TRACT | 5 refills | Status: DC

## 2023-06-05 MED ORDER — OMEPRAZOLE 40 MG PO CPDR: 40.0000 mg | DELAYED_RELEASE_CAPSULE | Freq: Every day | ORAL | 5 refills | Status: DC

## 2023-06-05 MED ORDER — ALBUTEROL SULFATE HFA 108 (90 BASE) MCG/ACT IN AERS: 2.0000 | INHALATION_SPRAY | Freq: Four times a day (QID) | RESPIRATORY_TRACT | 5 refills | Status: DC | PRN

## 2023-06-05 NOTE — Progress Notes (Signed)
Tracey Blackburn    [id number]    Aug 18, 1961  Primary Care Physician:Henson, Zorita Pang, NP-C Date of Appointment: 06/05/2023 Established Patient Visit  Chief complaint:   Chief Complaint  Patient presents with   Follow-up    Pt is here for 3 month Asthma PFT (06-04-2023) F/U visit. Pt wants to discuss discoloration on R and L arms.     HPI: Tracey Blackburn is a 62 y.o. woman with moderate persistent asthma.   Interval Updates: Here for follow up after spirometry and starting dulera inhaler with spacer.  Overall she is feeling "much much better" on the higher dose ICS.  She is using albuterol inhaler.  Prilosec helping the cough as well.   Current Regimen: dulera 200 2 puffs twice daily, albuterol prn Asthma Triggers: exertion URIs.  Exacerbations in the last year: twice in the last year.  History of hospitalization or intubation: twice in 2024.  Allergy Testing/rhinitis: yes GERD: yes on omeprazole ACT:  Asthma Control Test ACT Total Score  06/05/2023  9:35 AM 12   FeNO: 78 ppb on June 2024 Serum Eos/IgE: 500 in may 2024  I have reviewed the patient's family social and past medical history and updated as appropriate.   Past Medical History:  Diagnosis Date   Diabetes mellitus without complication (HCC)    Hypertension     Past Surgical History:  Procedure Laterality Date   BIOPSY  12/14/2022   Procedure: BIOPSY;  Surgeon: Jeani Hawking, MD;  Location: [facility];  Service: Gastroenterology;;   CESAREAN SECTION     COLONOSCOPY N/A 12/14/2022   Procedure: COLONOSCOPY;  Surgeon: Jeani Hawking, MD;  Location: [facility];  Service: Gastroenterology;  Laterality: N/A;   ESOPHAGOGASTRODUODENOSCOPY N/A 12/14/2022   Procedure: ESOPHAGOGASTRODUODENOSCOPY (EGD);  Surgeon: Jeani Hawking, MD;  Location: [facility];  Service: Gastroenterology;  Laterality: N/A;    Family History  Problem Relation Age of Onset   Diabetes Other    Hypertension Other    Asthma  Grandchild     Social History   Occupational History   Not on file  Tobacco Use   Smoking status: Never   Smokeless tobacco: Never  Vaping Use   Vaping status: Never Used  Substance and Sexual Activity   Alcohol use: No    Alcohol/week: 0.0 standard drinks of alcohol   Drug use: No   Sexual activity: Not Currently     Physical Exam: Blood pressure 110/70, pulse 98, height 5\' 2"  (1.575 m), weight 200 lb 12.8 oz (91.1 kg), SpO2 100%.  Gen:      No acute distress ENT:  no nasal polyps, mucus membranes moist Lungs:    No increased respiratory effort, symmetric chest wall excursion, clear to auscultation bilaterally, no wheezes or crackles CV:         Regular rate and rhythm; no murmurs, rubs, or gallops.  No pedal edema   Data Reviewed: Imaging: I have personally reviewed the ct angio may 2024 - bronchial wall thickening, no PE, patulous esophagus.   PFTs:     Latest Ref Rng & Units 06/03/2023   12:34 PM  PFT Results  FVC-Pre L 2.17  P  FVC-Predicted Pre % 72  P  Pre FEV1/FVC % % 82  P  FEV1-Pre L 1.77  P  FEV1-Predicted Pre % 76  P  DLCO uncorrected ml/min/mmHg 20.73  P  DLCO UNC% % 110  P  DLCO corrected ml/min/mmHg 20.73  P  DLCO COR %Predicted %  110  P  DLVA Predicted % 138  P    P Preliminary result   I have personally reviewed the patient's PFTs and no airflow limitation. Increased dlco  Labs: Lab Results  Component Value Date   NA 136 02/16/2023   K 3.9 02/16/2023   CO2 21 (L) 02/16/2023   GLUCOSE 143 (H) 02/16/2023   BUN 13 02/16/2023   CREATININE 0.61 02/16/2023   CALCIUM 8.9 02/16/2023   GFR 94.64 02/07/2023   EGFR 100.0 06/06/2022   GFRNONAA >60 02/16/2023   Lab Results  Component Value Date   WBC 8.9 02/16/2023   HGB 10.9 (L) 02/16/2023   HCT 35.6 (L) 02/16/2023   MCV 79.3 (L) 02/16/2023   PLT 293 02/16/2023      Immunization status:  There is no immunization history on file for this patient.  External Records Personally  Reviewed: family medicine  Assessment:  Moderate persistent asthma, improved control GERD, improved control Peripheral eosinophilia Need for influenza vaccine  Plan/Recommendations:  Your breathing testing was normal and this is typical for asthma.   Continue the dulera inhaler 2 puffs twice daily, gargle after use.  Continue your albuterol inhaler as needed.   Continue the prilosec medicine for the reflux.   We gave your flu shot today.      Return to Care: No follow-ups on file.   Durel Salts, MD Pulmonary and Critical Care Medicine [facility] Office:[phone]

## 2023-06-05 NOTE — Patient Instructions (Addendum)
It was a pleasure to see you today!  Please schedule follow up scheduled with myself in 6 months.  If my schedule is not open yet, we will contact you with a reminder closer to that time. Please call [phone] if you haven't heard from [place] a month before, and always call us sooner if issues or concerns arise. You can also send [place] a message through MyChart, but but aware that this is not to be used for urgent issues and it may take up to 5-7 days to receive a reply. Please be aware that you will likely be able to view your results before I have a chance to respond to them. Please give [place] 5 business days to respond to any non-urgent results.  It was a pleasure to see you today!  Before your next visit I would like you to have:  Your breathing testing was normal and this is typical for asthma.   Continue the dulera inhaler 2 puffs twice daily, gargle after use.  Continue your albuterol inhaler as needed.   Continue the prilosec medicine for the reflux.   We gave your flu shot today.

## 2023-06-21 ENCOUNTER — Encounter: Payer: Self-pay | Admitting: Family Medicine

## 2023-06-21 ENCOUNTER — Ambulatory Visit (INDEPENDENT_AMBULATORY_CARE_PROVIDER_SITE_OTHER): Payer: BC Managed Care – PPO | Admitting: Family Medicine

## 2023-06-21 VITALS — BP 132/70 | HR 75 | Temp 97.5°F | Ht 62.0 in | Wt 202.0 lb

## 2023-06-21 DIAGNOSIS — K529 Noninfective gastroenteritis and colitis, unspecified: Secondary | ICD-10-CM

## 2023-06-21 DIAGNOSIS — Z8719 Personal history of other diseases of the digestive system: Secondary | ICD-10-CM

## 2023-06-21 DIAGNOSIS — E559 Vitamin D deficiency, unspecified: Secondary | ICD-10-CM

## 2023-06-21 DIAGNOSIS — L819 Disorder of pigmentation, unspecified: Secondary | ICD-10-CM

## 2023-06-21 DIAGNOSIS — Z124 Encounter for screening for malignant neoplasm of cervix: Secondary | ICD-10-CM

## 2023-06-21 DIAGNOSIS — Z1231 Encounter for screening mammogram for malignant neoplasm of breast: Secondary | ICD-10-CM

## 2023-06-21 DIAGNOSIS — L816 Other disorders of diminished melanin formation: Secondary | ICD-10-CM

## 2023-06-21 DIAGNOSIS — R000 Tachycardia, unspecified: Secondary | ICD-10-CM

## 2023-06-21 DIAGNOSIS — I10 Essential (primary) hypertension: Secondary | ICD-10-CM

## 2023-06-21 LAB — CBC WITH DIFFERENTIAL/PLATELET
Basophils Absolute: 0 10*3/uL (ref 0.0–0.1)
Basophils Relative: 0.3 % (ref 0.0–3.0)
Eosinophils Absolute: 1 10*3/uL — ABNORMAL HIGH (ref 0.0–0.7)
Eosinophils Relative: 11.3 % — ABNORMAL HIGH (ref 0.0–5.0)
HCT: 34.2 % — ABNORMAL LOW (ref 36.0–46.0)
Hemoglobin: 10.6 g/dL — ABNORMAL LOW (ref 12.0–15.0)
Lymphocytes Relative: 18.3 % (ref 12.0–46.0)
Lymphs Abs: 1.5 10*3/uL (ref 0.7–4.0)
MCHC: 31 g/dL (ref 30.0–36.0)
MCV: 78.7 fL (ref 78.0–100.0)
Monocytes Absolute: 0.9 10*3/uL (ref 0.1–1.0)
Monocytes Relative: 10.2 % (ref 3.0–12.0)
Neutro Abs: 5.1 10*3/uL (ref 1.4–7.7)
Neutrophils Relative %: 59.9 % (ref 43.0–77.0)
Platelets: 356 10*3/uL (ref 150.0–400.0)
RBC: 4.35 Mil/uL (ref 3.87–5.11)
RDW: 16.5 % — ABNORMAL HIGH (ref 11.5–15.5)
WBC: 8.4 10*3/uL (ref 4.0–10.5)

## 2023-06-21 LAB — COMPREHENSIVE METABOLIC PANEL
ALT: 7 U/L (ref 0–35)
AST: 10 U/L (ref 0–37)
Albumin: 3.3 g/dL — ABNORMAL LOW (ref 3.5–5.2)
Alkaline Phosphatase: 53 U/L (ref 39–117)
BUN: 8 mg/dL (ref 6–23)
CO2: 28 meq/L (ref 19–32)
Calcium: 9.3 mg/dL (ref 8.4–10.5)
Chloride: 107 meq/L (ref 96–112)
Creatinine, Ser: 0.71 mg/dL (ref 0.40–1.20)
GFR: 91.16 mL/min (ref >60.00)
Glucose, Bld: 94 mg/dL (ref 70–99)
Potassium: 5.1 meq/L (ref 3.5–5.1)
Sodium: 143 meq/L (ref 135–145)
Total Bilirubin: 0.3 mg/dL (ref 0.2–1.2)
Total Protein: 7.2 g/dL (ref 6.0–8.3)

## 2023-06-21 LAB — VITAMIN D 25 HYDROXY (VIT D DEFICIENCY, FRACTURES): VITD: 15.61 ng/mL — ABNORMAL LOW (ref 30.00–100.00)

## 2023-06-21 MED ORDER — ATENOLOL 100 MG PO TABS: 100.0000 mg | ORAL_TABLET | Freq: Every day | ORAL | 1 refills | Status: DC

## 2023-06-21 MED ORDER — TERBINAFINE HCL 1 % EX CREA
1.0000 | TOPICAL_CREAM | Freq: Two times a day (BID) | CUTANEOUS | 0 refills | Status: AC
Start: 2023-06-21 — End: ?

## 2023-06-21 NOTE — Patient Instructions (Signed)
Use Lamisil on your arms. You can also get this over the counter if not affordable by prescription. Follow up if not improving in 4-6 weeks.   We will be in touch with your lab results.   You should hear from the gynecologist and [facility]

## 2023-06-21 NOTE — Progress Notes (Unsigned)
Subjective:     Patient ID: Tracey Blackburn, female    DOB: Oct 31, 1960, 62 y.o.   MRN: [id number]  Chief Complaint  Patient presents with   Medical Management of Chronic Issues    3 month f/u    HPI  Discussed the use of AI scribe software for clinical note transcription with the patient, who gave verbal consent to proceed.  History of Present Illness          Here to follow up on chronic health conditions.   HTN- taking medication. BP at home in goal.   Continues having diarrhea after eating. No blood. Hx of UC. Daughter states she cannot go back to Dr. Elnoria Howard or [place] due to owing them money.   Overdue for mammogram and pap smear.   Vitamin D def- is not taking supplement.     Health Maintenance Due  Topic Date Due   Hepatitis C Screening  Never done   DTaP/Tdap/Td (1 - Tdap) Never done   Cervical Cancer Screening (HPV/Pap Cotest)  Never done   MAMMOGRAM  Never done   Zoster Vaccines- Shingrix (1 of 2) Never done   COVID-19 Vaccine (1 - 2023-24 season) Never done    Past Medical History:  Diagnosis Date   Diabetes mellitus without complication (HCC)    Hypertension     Past Surgical History:  Procedure Laterality Date   BIOPSY  12/14/2022   Procedure: BIOPSY;  Surgeon: Jeani Hawking, MD;  Location: [facility];  Service: Gastroenterology;;   CESAREAN SECTION     COLONOSCOPY N/A 12/14/2022   Procedure: COLONOSCOPY;  Surgeon: Jeani Hawking, MD;  Location: [facility];  Service: Gastroenterology;  Laterality: N/A;   ESOPHAGOGASTRODUODENOSCOPY N/A 12/14/2022   Procedure: ESOPHAGOGASTRODUODENOSCOPY (EGD);  Surgeon: Jeani Hawking, MD;  Location: [facility];  Service: Gastroenterology;  Laterality: N/A;    Family History  Problem Relation Age of Onset   Diabetes Other    Hypertension Other    Asthma Grandchild     Social History   Socioeconomic History   Marital status: Divorced    Spouse name: Not on file   Number of children: Not on file   Years of  education: Not on file   Highest education level: Not on file  Occupational History   Not on file  Tobacco Use   Smoking status: Never   Smokeless tobacco: Never  Vaping Use   Vaping status: Never Used  Substance and Sexual Activity   Alcohol use: No    Alcohol/week: 0.0 standard drinks of alcohol   Drug use: No   Sexual activity: Not Currently  Other Topics Concern   Not on file  Social History Narrative   Not on file   Social Determinants of Health   Financial Resource Strain: Not on file  Food Insecurity: No Food Insecurity (02/15/2023)   Hunger Vital Sign    Worried About Running Out of Food in the Last Year: Never true    Ran Out of Food in the Last Year: Never true  Recent Concern: Food Insecurity - Food Insecurity Present (12/11/2022)   Hunger Vital Sign    Worried About Running Out of Food in the Last Year: Sometimes true    Ran Out of Food in the Last Year: Sometimes true  Transportation Needs: No Transportation Needs (02/15/2023)   PRAPARE - Administrator, Civil Service (Medical): No    Lack of Transportation (Non-Medical): No  Physical Activity: Not on file  Stress: Not on  file  Social Connections: Not on file  Intimate Partner Violence: Not At Risk (02/15/2023)   Humiliation, Afraid, Rape, and Kick questionnaire    Fear of Current or Ex-Partner: No    Emotionally Abused: No    Physically Abused: No    Sexually Abused: No    Outpatient Medications Prior to Visit  Medication Sig Dispense Refill   albuterol (VENTOLIN HFA) 108 (90 Base) MCG/ACT inhaler Inhale 2 puffs into the lungs every 6 (six) hours as needed for wheezing or shortness of breath. 8.5 g 5   benzonatate (TESSALON) 100 MG capsule Take 100 mg by mouth every 8 (eight) hours as needed for cough.     Cyanocobalamin (B-12 PO) Take 1 tablet by mouth daily after breakfast.     ferrous sulfate 325 (65 FE) MG EC tablet Take 1 tablet (325 mg total) by mouth daily with breakfast. (Patient taking  differently: Take 325 mg by mouth 2 (two) times daily with a meal.) 60 tablet 3   guaiFENesin (MUCINEX) 600 MG 12 hr tablet Take 2 tablets (1,200 mg total) by mouth 2 (two) times daily. (Patient taking differently: Take 600 mg by mouth in the morning and at bedtime.) 30 tablet 0   mometasone-formoterol (DULERA) 200-5 MCG/ACT AERO Inhale 2 puffs into the lungs in the morning and at bedtime. 1 each 5   omeprazole (PRILOSEC) 40 MG capsule Take 1 capsule (40 mg total) by mouth daily. 30 capsule 5   Vitamin D, Ergocalciferol, (DRISDOL) 1.25 MG (50000 UNIT) CAPS capsule Take 1 capsule (50,000 Units total) by mouth every 7 (seven) days. 4 capsule 2   atenolol (TENORMIN) 100 MG tablet Take 1 tablet (100 mg total) by mouth daily. 90 tablet 0   No facility-administered medications prior to visit.    No Known Allergies  Review of Systems  Constitutional:  Negative for chills, fever and malaise/fatigue.  Respiratory:  Negative for shortness of breath.   Cardiovascular:  Negative for chest pain, palpitations and leg swelling.  Gastrointestinal:  Positive for diarrhea. Negative for abdominal pain, blood in stool, constipation, nausea and vomiting.  Genitourinary:  Negative for dysuria, frequency and urgency.  Skin:  Positive for rash. Negative for itching.  Neurological:  Negative for dizziness and focal weakness.       Objective:    Physical Exam Constitutional:      General: She is not in acute distress.    Appearance: She is not ill-appearing.  Eyes:     Extraocular Movements: Extraocular movements intact.     Conjunctiva/sclera: Conjunctivae normal.  Cardiovascular:     Rate and Rhythm: Normal rate.  Pulmonary:     Effort: Pulmonary effort is normal.  Abdominal:     Palpations: Abdomen is soft.     Tenderness: There is no abdominal tenderness. There is no guarding.  Musculoskeletal:     Cervical back: Normal range of motion and neck supple.  Skin:    General: Skin is warm and dry.      Findings: Rash present.     Comments: Circular patches of hypopigmentation on anterior forearms.   Neurological:     General: No focal deficit present.     Mental Status: She is alert and oriented to person, place, and time.  Psychiatric:        Mood and Affect: Mood normal.        Behavior: Behavior normal.        Thought Content: Thought content normal.      BP  132/70 (BP Location: Left Arm, Patient Position: Sitting, Cuff Size: Large)   Pulse 75   Temp (!) 97.5 F (36.4 C) (Temporal)   Ht 5\' 2"  (1.575 m)   Wt 202 lb (91.6 kg)   SpO2 98%   BMI 36.95 kg/m  Wt Readings from Last 3 Encounters:  06/21/23 202 lb (91.6 kg)  06/05/23 200 lb 12.8 oz (91.1 kg)  04/11/23 207 lb (93.9 kg)       Assessment & Plan:   Problem List Items Addressed This Visit       Cardiovascular and Mediastinum   Essential hypertension - Primary   Relevant Medications   atenolol (TENORMIN) 100 MG tablet   Other Relevant Orders   CBC with Differential/Platelet   Comprehensive metabolic panel     Other   Tachycardia   Relevant Medications   atenolol (TENORMIN) 100 MG tablet   Other Visit Diagnoses     Vitamin D deficiency       Relevant Orders   VITAMIN D 25 Hydroxy (Vit-D Deficiency, Fractures)   Screening for cervical cancer       Relevant Orders   Ambulatory referral to Gynecology   Encounter for screening mammogram for malignant neoplasm of breast       Relevant Orders   Ambulatory referral to Gynecology   Skin hypopigmentation       Relevant Medications   terbinafine (LAMISIL AT) 1 % cream   Chronic diarrhea       Relevant Orders   Ambulatory referral to Gastroenterology   History of ulcerative colitis       Relevant Orders   Ambulatory referral to Gastroenterology       I am having Tracey Blackburn start on terbinafine. I am also having her maintain her guaiFENesin, ferrous sulfate, Cyanocobalamin (B-12 PO), Vitamin D (Ergocalciferol), benzonatate, Dulera, albuterol,  omeprazole, and atenolol.  Meds ordered this encounter  Medications   atenolol (TENORMIN) 100 MG tablet    Sig: Take 1 tablet (100 mg total) by mouth daily.    Dispense:  90 tablet    Refill:  1    Order Specific Question:   Supervising Provider    Answer:   Hillard Danker A [[id number]]   terbinafine (LAMISIL AT) 1 % cream    Sig: Apply 1 Application topically 2 (two) times daily.    Dispense:  30 g    Refill:  0    Order Specific Question:   Supervising Provider    Answer:   Hillard Danker A [[id number]]

## 2023-06-23 ENCOUNTER — Other Ambulatory Visit: Payer: Self-pay | Admitting: Family Medicine

## 2023-06-23 DIAGNOSIS — E559 Vitamin D deficiency, unspecified: Secondary | ICD-10-CM

## 2023-06-23 DIAGNOSIS — K529 Noninfective gastroenteritis and colitis, unspecified: Secondary | ICD-10-CM | POA: Insufficient documentation

## 2023-06-23 DIAGNOSIS — Z8719 Personal history of other diseases of the digestive system: Secondary | ICD-10-CM | POA: Insufficient documentation

## 2023-06-23 MED ORDER — VITAMIN D (ERGOCALCIFEROL) 1.25 MG (50000 UNIT) PO CAPS: 50000.0000 [IU] | ORAL_CAPSULE | ORAL | 2 refills | Status: DC

## 2023-06-23 NOTE — Assessment & Plan Note (Signed)
Controlled. Continue medication. Monitor at home. Low sodium diet.

## 2023-06-23 NOTE — Assessment & Plan Note (Signed)
She was lost to follow up after hospitalization for UC with bleeding. No longer bleeding. She will need to establish care with  GI since she was dismissed from Dr. Elnor[facility] Howard and [facility]. Referral made.

## 2023-06-23 NOTE — Assessment & Plan Note (Signed)
Check vitamin D level and follow up 

## 2023-06-23 NOTE — Progress Notes (Signed)
Please maker her aware of the following: "Your vitamin D is very low again. Please take the once weekly prescription for the next 3 months. When it is finished, take over the counter vitamin D3 1,000 IUs everyday.  Anemia is stable but please follow up with [facility] as referred. They should call you"

## 2023-06-23 NOTE — Assessment & Plan Note (Addendum)
Controlled. Continue atenolol 100 mg daily.

## 2023-06-27 ENCOUNTER — Encounter: Payer: Self-pay | Admitting: Family Medicine

## 2023-07-09 NOTE — Telephone Encounter (Signed)
Pt wants FMLA forms filled out, are you ok w this or would you like ov to discuss?

## 2023-07-28 ENCOUNTER — Encounter: Payer: Self-pay | Admitting: Family Medicine

## 2023-08-20 ENCOUNTER — Other Ambulatory Visit (HOSPITAL_COMMUNITY)
Admission: RE | Admit: 2023-08-20 | Discharge: 2023-08-20 | Disposition: A | Discharge status: Home / Self Care | Payer: BC Managed Care – PPO | Source: Ambulatory Visit | Attending: Radiology | Admitting: Radiology

## 2023-08-20 ENCOUNTER — Ambulatory Visit (INDEPENDENT_AMBULATORY_CARE_PROVIDER_SITE_OTHER): Payer: BC Managed Care – PPO | Admitting: Radiology

## 2023-08-20 ENCOUNTER — Encounter: Payer: Self-pay | Admitting: Radiology

## 2023-08-20 VITALS — BP 122/70 | Ht 61.25 in | Wt 204.0 lb

## 2023-08-20 DIAGNOSIS — Z01419 Encounter for gynecological examination (general) (routine) without abnormal findings: Secondary | ICD-10-CM | POA: Diagnosis present

## 2023-08-20 NOTE — Progress Notes (Signed)
Tracey Blackburn 12-12-1960 [id number]   History: Postmenopausal 62 y.o. presents for annual exam. Has not had gyn care in 'years'. Works full time at [employer] 3rd shift, walks 5+miles a day. Followed by PCP. Lives with her daughter. No gyn concerns.    Gynecologic History Postmenopausal Last Pap: unsure >10 years ago Last mammogram: never Last colonoscopy: 2024   Obstetric History OB History  Gravida Para Term Preterm AB Living  2 2       1   SAB IAB Ectopic Multiple Live Births               # Outcome Date GA Lbr Len/2nd Weight Sex Type Anes PTL Lv  2 Para           1 Para            Past Medical History:  Diagnosis Date   Asthma    Diabetes mellitus without complication (HCC)    Hypertension    Ulcerative colitis (HCC)     Current Outpatient Medications on File Prior to Visit  Medication Sig Dispense Refill   albuterol (VENTOLIN HFA) 108 (90 Base) MCG/ACT inhaler Inhale 2 puffs into the lungs every 6 (six) hours as needed for wheezing or shortness of breath. 8.5 g 5   atenolol (TENORMIN) 100 MG tablet Take 1 tablet (100 mg total) by mouth daily. 90 tablet 1   Cyanocobalamin (B-12 PO) Take 1 tablet by mouth daily after breakfast.     ferrous sulfate 325 (65 FE) MG EC tablet Take 1 tablet (325 mg total) by mouth daily with breakfast. (Patient taking differently: Take 325 mg by mouth 2 (two) times daily with a meal.) 60 tablet 3   guaiFENesin (MUCINEX) 600 MG 12 hr tablet Take 2 tablets (1,200 mg total) by mouth 2 (two) times daily. (Patient taking differently: Take 600 mg by mouth in the morning and at bedtime.) 30 tablet 0   mometasone-formoterol (DULERA) 200-5 MCG/ACT AERO Inhale 2 puffs into the lungs in the morning and at bedtime. 1 each 5   omeprazole (PRILOSEC) 40 MG capsule Take 1 capsule (40 mg total) by mouth daily. 30 capsule 5   terbinafine (LAMISIL AT) 1 % cream Apply 1 Application topically 2 (two) times daily. 30 g 0   Vitamin D, Ergocalciferol, (DRISDOL) 1.25 MG  (50000 UNIT) CAPS capsule Take 1 capsule (50,000 Units total) by mouth every 7 (seven) days. 4 capsule 2   No current facility-administered medications on file prior to visit.     The following portions of the patient's history were reviewed and updated as appropriate: allergies, current medications, past family history, past medical history, past social history, past surgical history, and problem list.  Review of Systems Pertinent items noted in HPI and remainder of comprehensive ROS otherwise negative.  Past medical history, past surgical history, family history and social history were all reviewed and documented in the EPIC chart.  Exam:  Vitals:   08/20/23 1114  BP: 122/70  Weight: 204 lb (92.5 kg)  Height: 5' 1.25" (1.556 m)   Body mass index is 38.23 kg/m.  General appearance:  Normal, obese Thyroid:  Symmetrical, normal in size, without palpable masses or nodularity. Respiratory  Auscultation:  Clear without wheezing or rhonchi Cardiovascular  Auscultation:  Regular rate, without rubs, murmurs or gallops  Edema/varicosities:  Not grossly evident Abdominal  Soft,nontender, without masses, guarding or rebound.  Liver/spleen:  No organomegaly noted  Hernia:  None appreciated  Skin  Inspection:  Grossly normal  with multiple areas of hypopigmentation on arms and legs. Breasts: Examined lying and sitting.   Right: Without masses, retractions, nipple discharge or axillary adenopathy.   Left: Without masses, retractions, nipple discharge or axillary adenopathy. Genitourinary   Inguinal/mons:  Normal without inguinal adenopathy  External genitalia:  Normal appearing vulva with no masses, tenderness, or lesions, hypopigmentation  BUS/Urethra/Skene's glands:  Normal  Vagina:  Normal appearing with normal color and discharge, no lesions. Atrophy: moderate   Cervix:  Normal appearing without discharge or lesions  Uterus:  Normal in size, shape and contour.  Midline and mobile,  nontender  Adnexa/parametria:     Rt: Normal in size, without masses or tenderness.   Lt: Normal in size, without masses or tenderness.  Anus and perineum: Normal    Raynelle Fanning, CMA present for exam  Assessment/Plan:   1. Well woman exam with routine gynecological exam - Schedule screening mammogram, information given for the breast center - Cytology - PAP( [facility])    Discussed SBE, colonoscopy and DEXA screening as directed. Recommend of exercise weekly, including weight bearing exercise. Return in 1 year for annual or sooner prn.  Tanda Rockers WHNP-BC, 11:29 AM 08/20/2023

## 2023-08-23 LAB — CYTOLOGY - PAP
Adequacy: ABSENT
Diagnosis: NEGATIVE

## 2023-09-25 ENCOUNTER — Encounter: Payer: Self-pay | Admitting: Family Medicine

## 2023-09-26 ENCOUNTER — Telehealth: Payer: Self-pay | Admitting: Family Medicine

## 2023-09-26 ENCOUNTER — Other Ambulatory Visit: Payer: Self-pay | Admitting: Family Medicine

## 2023-09-26 ENCOUNTER — Ambulatory Visit: Payer: BC Managed Care – PPO | Admitting: Family Medicine

## 2023-09-26 ENCOUNTER — Encounter: Payer: Self-pay | Admitting: Family Medicine

## 2023-09-26 ENCOUNTER — Ambulatory Visit: Payer: BC Managed Care – PPO

## 2023-09-26 VITALS — BP 132/74 | HR 77 | Temp 98.6°F | Ht 61.25 in | Wt 205.0 lb

## 2023-09-26 DIAGNOSIS — R0602 Shortness of breath: Secondary | ICD-10-CM

## 2023-09-26 DIAGNOSIS — R051 Acute cough: Secondary | ICD-10-CM

## 2023-09-26 DIAGNOSIS — R062 Wheezing: Secondary | ICD-10-CM

## 2023-09-26 DIAGNOSIS — J4541 Moderate persistent asthma with (acute) exacerbation: Secondary | ICD-10-CM | POA: Diagnosis not present

## 2023-09-26 DIAGNOSIS — I517 Cardiomegaly: Secondary | ICD-10-CM

## 2023-09-26 LAB — COMPREHENSIVE METABOLIC PANEL
ALT: 9 U/L (ref 0–35)
AST: 20 U/L (ref 0–37)
Albumin: 4.2 g/dL (ref 3.5–5.2)
Alkaline Phosphatase: 61 U/L (ref 39–117)
BUN: 7 mg/dL (ref 6–23)
CO2: 26 meq/L (ref 19–32)
Calcium: 9.5 mg/dL (ref 8.4–10.5)
Chloride: 103 meq/L (ref 96–112)
Creatinine, Ser: 0.85 mg/dL (ref 0.40–1.20)
GFR: 73.31 mL/min (ref >60.00)
Glucose, Bld: 114 mg/dL — ABNORMAL HIGH (ref 70–99)
Potassium: 4.5 meq/L (ref 3.5–5.1)
Sodium: 138 meq/L (ref 135–145)
Total Bilirubin: 0.3 mg/dL (ref 0.2–1.2)
Total Protein: 8.1 g/dL (ref 6.0–8.3)

## 2023-09-26 LAB — CBC WITH DIFFERENTIAL/PLATELET
Basophils Absolute: 0 10*3/uL (ref 0.0–0.1)
Basophils Relative: 0.5 % (ref 0.0–3.0)
Eosinophils Absolute: 0.3 10*3/uL (ref 0.0–0.7)
Eosinophils Relative: 5.1 % — ABNORMAL HIGH (ref 0.0–5.0)
HCT: 40.1 % (ref 36.0–46.0)
Hemoglobin: 12.8 g/dL (ref 12.0–15.0)
Lymphocytes Relative: 19.2 % (ref 12.0–46.0)
Lymphs Abs: 1.2 10*3/uL (ref 0.7–4.0)
MCHC: 32 g/dL (ref 30.0–36.0)
MCV: 77.9 fL — ABNORMAL LOW (ref 78.0–100.0)
Monocytes Absolute: 0.7 10*3/uL (ref 0.1–1.0)
Monocytes Relative: 11.2 % (ref 3.0–12.0)
Neutro Abs: 4 10*3/uL (ref 1.4–7.7)
Neutrophils Relative %: 64 % (ref 43.0–77.0)
Platelets: 223 10*3/uL (ref 150.0–400.0)
RBC: 5.15 Mil/uL — ABNORMAL HIGH (ref 3.87–5.11)
RDW: 18.1 % — ABNORMAL HIGH (ref 11.5–15.5)
WBC: 6.2 10*3/uL (ref 4.0–10.5)

## 2023-09-26 LAB — POCT INFLUENZA A/B
Influenza A, POC: NEGATIVE
Influenza B, POC: NEGATIVE

## 2023-09-26 LAB — POC COVID19 BINAXNOW: SARS Coronavirus 2 Ag: NEGATIVE

## 2023-09-26 LAB — BRAIN NATRIURETIC PEPTIDE: Pro B Natriuretic peptide (BNP): 35 pg/mL (ref 0.0–100.0)

## 2023-09-26 MED ORDER — BENZONATATE 200 MG PO CAPS
200.0000 mg | ORAL_CAPSULE | Freq: Two times a day (BID) | ORAL | 0 refills | Status: AC | PRN
Start: 2023-09-26 — End: ?

## 2023-09-26 MED ORDER — PREDNISONE 20 MG PO TABS
40.0000 mg | ORAL_TABLET | Freq: Every day | ORAL | 0 refills | Status: AC
Start: 2023-09-26 — End: ?

## 2023-09-26 MED ORDER — FUROSEMIDE 20 MG PO TABS: 20.0000 mg | ORAL_TABLET | Freq: Every day | ORAL | 0 refills | Status: DC

## 2023-09-26 NOTE — Telephone Encounter (Signed)
 Patient dropped off document FMLA, to be filled out by provider. Patient requested to send it back via Call Patient to pick up within 7-days. Document is located in providers tray at front office.Please advise at Mobile [phone] (mobile)

## 2023-09-26 NOTE — Telephone Encounter (Signed)
 Appt scheduled

## 2023-09-26 NOTE — Progress Notes (Signed)
 Subjective:  Tracey Blackburn is a 63 y.o. female who presents for a 2 day hx of acute onset cough, chest congestion, wheezing and shortness of breath. She reports taking Dulera  twice daily.  Denies using albuterol .   Denies fever, chills, dizziness, headache, ear pain, nasal congestion, ST, chest pain, palpitations, abdominal pain, N/V/D, urinary symptoms, LE edema.   She sees Adult Nurse Pulmonology for asthma.    ROS as in subjective.   Objective: Vitals:   09/26/23 0835  BP: 132/74  Pulse: 77  Temp: 98.6 F (37 C)  SpO2: 96%    General appearance: Alert, WD/WN, no distress, mildly ill appearing                             Skin: warm, no rash                           Head: no sinus tenderness                            Eyes: conjunctiva normal, corneas clear, PERRLA                            Ears: pearly TMs, external ear canals normal                          Nose: septum midline, turbinates swollen, with erythema and clear discharge             Mouth/throat: MMM, tongue normal, mild pharyngeal erythema                           Neck: supple, no adenopathy, no thyromegaly, nontender                          Heart: RRR                         Lungs: exp wheezes throughout, worse upper left, no rales, or rhonchi      Assessment: Moderate persistent asthma with acute exacerbation - Plan: predniSONE  (DELTASONE ) 20 MG tablet  Acute cough - Plan: POC COVID-19 BinaxNow, POCT Influenza A/B, DG Chest 2 View, benzonatate  (TESSALON ) 200 MG capsule  Wheezing - Plan: POC COVID-19 BinaxNow, POCT Influenza A/B, DG Chest 2 View, predniSONE  (DELTASONE ) 20 MG tablet  Shortness of breath - Plan: POC COVID-19 BinaxNow, POCT Influenza A/B, DG Chest 2 View   Plan: Negative Covid and flu tests.  Asthma flare. Vitals stable.  Chest X ray ordered to r/o underlying pneumonia.  Oral steroids and Tessalon  prescribed.  She has not been using albuterol  inhaler so counseling done on her maintenance  inhaler, Dulera  vs albuterol  for rescue.  Continue Mucinex . Stay hydrated.  Follow up if not improving in the next 2-3 days or if worsening.

## 2023-09-26 NOTE — Telephone Encounter (Signed)
 Forms received, will need to wait to be addressed at f/u visit next week per PCP

## 2023-09-26 NOTE — Patient Instructions (Signed)
 Please go downstairs for chest x-ray before you leave.  Use your albuterol  inhaler as prescribed for the next 2 to 3 days while you are having symptoms.  Take the steroids by mouth as prescribed with food and plenty of water.  Continue using your twice daily inhaler, Dulera .  Follow-up with [facility] pulmonology as recommended.

## 2023-09-26 NOTE — Progress Notes (Signed)
 Please call and let her know that I would like for her to come over for labs.  Her x-ray shows that her heart is enlarged with enlarged blood vessels suggesting that she may have early heart failure.  I will also prescribe a medication called furosemide  which is a diuretic or fluid pill for her to take for the next 5 days.  I would like for her to follow-up with me in 5 to 7 days.

## 2023-10-03 ENCOUNTER — Ambulatory Visit: Payer: BC Managed Care – PPO | Admitting: Family Medicine

## 2023-10-03 ENCOUNTER — Encounter: Payer: Self-pay | Admitting: Family Medicine

## 2023-10-03 VITALS — BP 136/82 | HR 85 | Temp 97.7°F | Ht 61.25 in | Wt 209.0 lb

## 2023-10-03 DIAGNOSIS — I4510 Unspecified right bundle-branch block: Secondary | ICD-10-CM | POA: Diagnosis not present

## 2023-10-03 DIAGNOSIS — R0602 Shortness of breath: Secondary | ICD-10-CM

## 2023-10-03 DIAGNOSIS — I517 Cardiomegaly: Secondary | ICD-10-CM | POA: Insufficient documentation

## 2023-10-03 DIAGNOSIS — J4541 Moderate persistent asthma with (acute) exacerbation: Secondary | ICD-10-CM

## 2023-10-03 DIAGNOSIS — I10 Essential (primary) hypertension: Secondary | ICD-10-CM

## 2023-10-03 LAB — BASIC METABOLIC PANEL
BUN: 13 mg/dL (ref 6–23)
CO2: 28 meq/L (ref 19–32)
Calcium: 9.5 mg/dL (ref 8.4–10.5)
Chloride: 103 meq/L (ref 96–112)
Creatinine, Ser: 0.88 mg/dL (ref 0.40–1.20)
GFR: 70.31 mL/min (ref >60.00)
Glucose, Bld: 146 mg/dL — ABNORMAL HIGH (ref 70–99)
Potassium: 4.8 meq/L (ref 3.5–5.1)
Sodium: 139 meq/L (ref 135–145)

## 2023-10-03 NOTE — Patient Instructions (Addendum)
 Please go downstairs for labs.   I am referring you to [facility] and they will call you to schedule.   Follow up with your pulmonologist as scheduled.   Monitor your blood pressure and if your readings are consistently higher than 130/80, let me know.

## 2023-10-03 NOTE — Progress Notes (Signed)
 Subjective:     Patient ID: Tracey Blackburn, female    DOB: [birth year], 63 y.o.   MRN: [id number]  Chief Complaint  Patient presents with   Follow-up    FMLA forms     HPI   History of Present Illness         Here to follow up on asthma exacerbation last week. She is feeling much better. No longer having shortness of breath. No edema. Denies chest pain or palpitations.   Chest X ray last week showed cardiomegaly and some vascular congestion.   Normal BNP.   She took furosemide  x 5 days and back today for follow up.   Previous cardiovascular imaging and echocardiogram done in 2024 and were unremarkable.   She has never had seen a cardiologist and her daughter would like a referral.      Health Maintenance Due  Topic Date Due   Hepatitis C Screening  Never done   DTaP/Tdap/Td (1 - Tdap) Never done   MAMMOGRAM  Never done   Zoster Vaccines- Shingrix (1 of 2) Never done    Past Medical History:  Diagnosis Date   Asthma    Diabetes mellitus without complication (HCC)    Hypertension    Ulcerative colitis (HCC)     Past Surgical History:  Procedure Laterality Date   BIOPSY  12/14/2022   Procedure: BIOPSY;  Surgeon: Rollin Dover, MD;  Location: [facility];  Service: Gastroenterology;;   CESAREAN SECTION     COLONOSCOPY N/A 12/14/2022   Procedure: COLONOSCOPY;  Surgeon: Rollin Dover, MD;  Location: [facility];  Service: Gastroenterology;  Laterality: N/A;   ESOPHAGOGASTRODUODENOSCOPY N/A 12/14/2022   Procedure: ESOPHAGOGASTRODUODENOSCOPY (EGD);  Surgeon: Rollin Dover, MD;  Location: [facility];  Service: Gastroenterology;  Laterality: N/A;    Family History  Problem Relation Age of Onset   Hypertension Mother    Lung cancer Mother    Hypertension Father    Lung cancer Father    Diabetes Father    Epilepsy Sister    Cervical cancer Sister    Hypertension Brother    Diabetes Brother    Epilepsy Brother    Diabetes Brother    Asthma Grandchild     Diabetes Other    Hypertension Other     Social History   Socioeconomic History   Marital status: Divorced    Spouse name: Not on file   Number of children: Not on file   Years of education: Not on file   Highest education level: Not on file  Occupational History   Not on file  Tobacco Use   Smoking status: Never    Passive exposure: Past   Smokeless tobacco: Never  Vaping Use   Vaping status: Never Used  Substance and Sexual Activity   Alcohol use: No    Alcohol/week: 0.0 standard drinks of alcohol   Drug use: No   Sexual activity: Not Currently    Partners: Male    Birth control/protection: Post-menopausal    Comment: menarche 63yo, sexual debut 63yo  Other Topics Concern   Not on file  Social History Narrative   Not on file   Social Drivers of Health   Financial Resource Strain: Not on file  Food Insecurity: No Food Insecurity (02/15/2023)   Hunger Vital Sign    Worried About Running Out of Food in the Last Year: Never true    Ran Out of Food in the Last Year: Never true  Recent Concern: Food Insecurity - Food  Insecurity Present (12/11/2022)   Hunger Vital Sign    Worried About Running Out of Food in the Last Year: Sometimes true    Ran Out of Food in the Last Year: Sometimes true  Transportation Needs: No Transportation Needs (02/15/2023)   PRAPARE - Administrator, Civil Service (Medical): No    Lack of Transportation (Non-Medical): No  Physical Activity: Not on file  Stress: Not on file  Social Connections: Not on file  Intimate Partner Violence: Not At Risk (02/15/2023)   Humiliation, Afraid, Rape, and Kick questionnaire    Fear of Current or Ex-Partner: No    Emotionally Abused: No    Physically Abused: No    Sexually Abused: No    Outpatient Medications Prior to Visit  Medication Sig Dispense Refill   albuterol  (VENTOLIN  HFA) 108 (90 Base) MCG/ACT inhaler Inhale 2 puffs into the lungs every 6 (six) hours as needed for wheezing or shortness  of breath. 8.5 g 5   atenolol  (TENORMIN ) 100 MG tablet Take 1 tablet (100 mg total) by mouth daily. 90 tablet 1   benzonatate  (TESSALON ) 200 MG capsule Take 1 capsule (200 mg total) by mouth 2 (two) times daily as needed for cough. 20 capsule 0   Cyanocobalamin  (B-12 PO) Take 1 tablet by mouth daily after breakfast.     ferrous sulfate  325 (65 FE) MG EC tablet Take 1 tablet (325 mg total) by mouth daily with breakfast. (Patient taking differently: Take 325 mg by mouth 2 (two) times daily with a meal.) 60 tablet 3   furosemide  (LASIX ) 20 MG tablet Take 1 tablet (20 mg total) by mouth daily. 5 tablet 0   guaiFENesin  (MUCINEX ) 600 MG 12 hr tablet Take 2 tablets (1,200 mg total) by mouth 2 (two) times daily. (Patient taking differently: Take 600 mg by mouth in the morning and at bedtime.) 30 tablet 0   mometasone-formoterol  (DULERA ) 200-5 MCG/ACT AERO Inhale 2 puffs into the lungs in the morning and at bedtime. 1 each 5   omeprazole  (PRILOSEC) 40 MG capsule Take 1 capsule (40 mg total) by mouth daily. 30 capsule 5   predniSONE  (DELTASONE ) 20 MG tablet Take 2 tablets (40 mg total) by mouth daily with breakfast. 10 tablet 0   terbinafine  (LAMISIL  AT) 1 % cream Apply 1 Application topically 2 (two) times daily. 30 g 0   Vitamin D , Ergocalciferol , (DRISDOL ) 1.25 MG (50000 UNIT) CAPS capsule Take 1 capsule (50,000 Units total) by mouth every 7 (seven) days. 4 capsule 2   No facility-administered medications prior to visit.    No Known Allergies  Review of Systems  Constitutional:  Negative for chills, fever and malaise/fatigue.  Respiratory:  Negative for shortness of breath.   Cardiovascular:  Negative for chest pain, palpitations and leg swelling.  Gastrointestinal:  Negative for abdominal pain, constipation, diarrhea, nausea and vomiting.  Genitourinary:  Negative for dysuria, frequency and urgency.  Neurological:  Negative for dizziness and focal weakness.  Psychiatric/Behavioral:  Negative for  depression. The patient is not nervous/anxious.        Objective:    Physical Exam Constitutional:      General: She is not in acute distress.    Appearance: She is not ill-appearing.  Eyes:     Extraocular Movements: Extraocular movements intact.     Conjunctiva/sclera: Conjunctivae normal.  Cardiovascular:     Rate and Rhythm: Normal rate and regular rhythm.  Pulmonary:     Effort: Pulmonary effort is normal.  Breath sounds: Normal breath sounds.  Musculoskeletal:     Cervical back: Normal range of motion and neck supple.     Right lower leg: No edema.     Left lower leg: No edema.  Skin:    General: Skin is warm and dry.  Neurological:     General: No focal deficit present.     Mental Status: She is alert and oriented to person, place, and time.  Psychiatric:        Mood and Affect: Mood normal.        Behavior: Behavior normal.        Thought Content: Thought content normal.      BP 136/82 (BP Location: Left Arm, Patient Position: Sitting, Cuff Size: Large)   Pulse 85   Temp 97.7 F (36.5 C) (Temporal)   Ht 5' 1.25 (1.556 m)   Wt 209 lb (94.8 kg)   SpO2 97%   BMI 39.17 kg/m  Wt Readings from Last 3 Encounters:  10/03/23 209 lb (94.8 kg)  09/26/23 205 lb (93 kg)  08/20/23 204 lb (92.5 kg)       Assessment & Plan:   Problem List Items Addressed This Visit     Cardiomegaly   Relevant Orders   EKG 12-Lead   Basic metabolic panel   Ambulatory referral to Cardiology   Dyspnea   Relevant Orders   Ambulatory referral to Cardiology   Essential hypertension   Relevant Orders   EKG 12-Lead   Basic metabolic panel   Ambulatory referral to Cardiology   RBBB   Relevant Orders   Ambulatory referral to Cardiology   Other Visit Diagnoses       Moderate persistent asthma with acute exacerbation    -  Primary   Relevant Orders   Basic metabolic panel      Here today for follow up from what was thought to be asthma exacerbation last week. She is  back to baseline after a course of oral steroids and 5 day course of furosemide  after chest X ray showed vascular congestion and cardiomegaly. Normal BNP.  Review of EMR shows echo in March 2024 with grade 1 diastolic dysfunction but otherwise unremarkable with EF 55-60%. CTA chest in May 2024 with bronchitis but otherwise negative.  EKG today shows NSR, rate 70, RBBB which was present in May.  Referral to cardiology for further evaluation in light of new findings on chest X ray and family request.  She will continue close monitoring of BP at home, low sodium diet and recheck BMP today due to short course of diuretics.  Follow up with pulmonology as scheduled.  Follow up here in 4 months.   I am having Haydyn Gertsch maintain her guaiFENesin , ferrous sulfate , Cyanocobalamin  (B-12 PO), Dulera , albuterol , omeprazole , atenolol , terbinafine , Vitamin D  (Ergocalciferol ), predniSONE , benzonatate , and furosemide .  No orders of the defined types were placed in this encounter.

## 2023-10-03 NOTE — Telephone Encounter (Signed)
 Discussed w pt at visit, vickie will not be completing these as they were for pt daughter

## 2023-12-03 ENCOUNTER — Ambulatory Visit: Payer: BC Managed Care – PPO | Admitting: Internal Medicine

## 2023-12-03 ENCOUNTER — Encounter: Payer: Self-pay | Admitting: Internal Medicine

## 2023-12-03 VITALS — BP 122/70 | HR 72 | Temp 98.0°F | Ht 62.0 in | Wt 209.0 lb

## 2023-12-03 DIAGNOSIS — K219 Gastro-esophageal reflux disease without esophagitis: Secondary | ICD-10-CM | POA: Diagnosis not present

## 2023-12-03 DIAGNOSIS — J4540 Moderate persistent asthma, uncomplicated: Secondary | ICD-10-CM

## 2023-12-03 DIAGNOSIS — K2100 Gastro-esophageal reflux disease with esophagitis, without bleeding: Secondary | ICD-10-CM

## 2023-12-03 MED ORDER — BUDESONIDE-FORMOTEROL FUMARATE 160-4.5 MCG/ACT IN AERO: 2.0000 | INHALATION_SPRAY | Freq: Two times a day (BID) | RESPIRATORY_TRACT | 12 refills | Status: DC

## 2023-12-03 MED ORDER — BREZTRI AEROSPHERE 160-9-4.8 MCG/ACT IN AERO: 2.0000 | INHALATION_SPRAY | Freq: Two times a day (BID) | RESPIRATORY_TRACT | 5 refills | Status: AC

## 2023-12-03 NOTE — Progress Notes (Signed)
 Sarely Stracener    [id number]    Feb 10, 1961  Primary Care Physician:Henson, Zorita Pang, NP-C Date of Appointment: 12/03/2023 Established Patient Visit  Chief complaint:   Chief Complaint  Patient presents with   Follow-up    Patient states she doing good.      HPI: Tracey Blackburn is a 63 y.o. woman with moderate persistent asthma and GERD.   Interval Updates: Here for follow up for her asthma.  Overall she is feeling "much much better" on the higher dose ICS but She does have dyspnea with house work: carrying things around the house, doing vacuuming. She has to slow down.  She had an asthma exacerbation in June 2025  Reflux is doing well on prilosec Albuterol use is 1-2 times/week.   Elwin Sleight is no longer covered by her insurance company and she needs an alternative medication.   Current Regimen: dulera 200 2 puffs twice daily, albuterol prn Asthma Triggers: exertion, URIs.  Exacerbations in the last year: 1  History of hospitalization or intubation: twice in 2024.  Allergy Testing/rhinitis: yes GERD: yes on omeprazole, controlled ACT:  Asthma Control Test ACT Total Score  12/03/2023  9:00 AM 17  06/05/2023  9:35 AM 12   FeNO: 78 ppb on June 2024 Serum Eos/IgE: 500 in may 2024  I have reviewed the patient's family social and past medical history and updated as appropriate.   Past Medical History:  Diagnosis Date   Asthma    Diabetes mellitus without complication (HCC)    Hypertension    Ulcerative colitis [facility])     Past Surgical History:  Procedure Laterality Date   BIOPSY  12/14/2022   Procedure: BIOPSY;  Surgeon: Jeani Hawking, MD;  Location: [facility];  Service: Gastroenterology;;   CESAREAN SECTION     COLONOSCOPY N/A 12/14/2022   Procedure: COLONOSCOPY;  Surgeon: Jeani Hawking, MD;  Location: [facility];  Service: Gastroenterology;  Laterality: N/A;   ESOPHAGOGASTRODUODENOSCOPY N/A 12/14/2022   Procedure: ESOPHAGOGASTRODUODENOSCOPY (EGD);   Surgeon: Jeani Hawking, MD;  Location: [facility];  Service: Gastroenterology;  Laterality: N/A;    Family History  Problem Relation Age of Onset   Hypertension Mother    Lung cancer Mother    Hypertension Father    Lung cancer Father    Diabetes Father    Epilepsy Sister    Cervical cancer Sister    Hypertension Brother    Diabetes Brother    Epilepsy Brother    Diabetes Brother    Asthma Grandchild    Diabetes Other    Hypertension Other     Social History   Occupational History   Not on file  Tobacco Use   Smoking status: Never    Passive exposure: Past   Smokeless tobacco: Never  Vaping Use   Vaping status: Never Used  Substance and Sexual Activity   Alcohol use: No    Alcohol/week: 0.0 standard drinks of alcohol   Drug use: No   Sexual activity: Not Currently    Partners: Male    Birth control/protection: Post-menopausal    Comment: menarche 63yo, sexual debut 63yo     Physical Exam: Blood pressure 122/70, pulse 72, temperature 98 F (36.7 C), temperature source Oral, height 5\' 2"  (1.575 m), weight 209 lb (94.8 kg), SpO2 100%.  Gen:      No acute distress, well appearing Lungs:   ctab no wheezes or crackles CV:         RRR no  mrg   Data Reviewed: Imaging: I have personally reviewed the ct angio may 2024 - bronchial wall thickening, no PE, patulous esophagus.   PFTs:     Latest Ref Rng & Units 06/03/2023   12:34 PM  PFT Results  FVC-Pre L 2.17   FVC-Predicted Pre % 72   Pre FEV1/FVC % % 82   FEV1-Pre L 1.77   FEV1-Predicted Pre % 76   DLCO uncorrected ml/min/mmHg 20.73   DLCO UNC% % 110   DLCO corrected ml/min/mmHg 20.73   DLCO COR %Predicted % 110   DLVA Predicted % 138    I have personally reviewed the patient's PFTs and no airflow limitation. Increased dlco  Labs: Lab Results  Component Value Date   NA 139 10/03/2023   K 4.8 10/03/2023   CO2 28 10/03/2023   GLUCOSE 146 (H) 10/03/2023   BUN 13 10/03/2023   CREATININE 0.88  10/03/2023   CALCIUM 9.5 10/03/2023   GFR 70.31 10/03/2023   EGFR 100.0 06/06/2022   GFRNONAA >60 02/16/2023   Lab Results  Component Value Date   WBC 6.2 09/26/2023   HGB 12.8 09/26/2023   HCT 40.1 09/26/2023   MCV 77.9 (L) 09/26/2023   PLT 223.0 09/26/2023      Immunization status: Immunization History  Administered Date(s) Administered   Influenza, Seasonal, Injecte, Preservative Fre 06/05/2023    External Records Personally Reviewed: family medicine  Assessment:  Moderate persistent asthma not well controlled GERD, improved control Peripheral eosinophilia   Plan/Recommendations:  I think there is still some room for improvement in your breathing. Elwin Sleight is no longer covered, but you can finish out the inhalers you have. When you're done switch to Breztri 2 puffs twice daily, gargle after use.  Continue albuterol inhaler as needed   Continue prilosec for acid reflux.   If you are still having trouble with your breathing at the next visit and having frequent asthma flares we may talk about injectable medications to help treat your asthma.    Return to Care: Return in about 6 months (around 06/04/2024).   Durel Salts, MD Pulmonary and Critical Care Medicine [facility] Office:[phone]

## 2023-12-03 NOTE — Patient Instructions (Addendum)
 It was a pleasure to see you today!  Please schedule follow up scheduled with myself in 6 months.  If my schedule is not open yet, we will contact you with a reminder closer to that time. Please call [phone] if you haven't heard from [place] a month before, and always call us sooner if issues or concerns arise. You can also send [place] a message through MyChart, but but aware that this is not to be used for urgent issues and it may take up to 5-7 days to receive a reply. Please be aware that you will likely be able to view your results before I have a chance to respond to them. Please give [place] 5 business days to respond to any non-urgent results.    I think there is still some room for improvement in your breathing. Elwin Sleight is no longer covered, but you can finish out the inhalers you have. When you're done switch to Breztri 2 puffs twice daily, gargle after use.  Continue albuterol inhaler as needed   Continue prilosec for acid reflux.   If you are still having trouble with your breathing at the next visit and having frequent asthma flares we may talk about injectable medications to help treat your asthma.   Sometimes the inhalers we prescribe are either not covered or are too expensive.  In most cases your doctor can substitute an alternate inhaler which is covered under your insurance formulary.  Please call your insurance company to find out which inhaler is covered and let us know, and we can prescribe an alternative.

## 2023-12-14 ENCOUNTER — Other Ambulatory Visit: Payer: Self-pay | Admitting: Family Medicine

## 2023-12-14 DIAGNOSIS — R000 Tachycardia, unspecified: Secondary | ICD-10-CM

## 2023-12-14 DIAGNOSIS — I10 Essential (primary) hypertension: Secondary | ICD-10-CM

## 2023-12-18 ENCOUNTER — Ambulatory Visit: Payer: BC Managed Care – PPO | Attending: Internal Medicine | Admitting: Internal Medicine

## 2023-12-18 ENCOUNTER — Encounter: Payer: Self-pay | Admitting: Internal Medicine

## 2023-12-18 VITALS — BP 128/72 | HR 67 | Ht 62.0 in | Wt 210.0 lb

## 2023-12-18 DIAGNOSIS — I10 Essential (primary) hypertension: Secondary | ICD-10-CM | POA: Diagnosis not present

## 2023-12-18 DIAGNOSIS — Z7689 Persons encountering health services in other specified circumstances: Secondary | ICD-10-CM | POA: Diagnosis not present

## 2023-12-18 DIAGNOSIS — R079 Chest pain, unspecified: Secondary | ICD-10-CM

## 2023-12-18 MED ORDER — FUROSEMIDE 20 MG PO TABS: 20.0000 mg | ORAL_TABLET | Freq: Every day | ORAL | 3 refills | Status: DC

## 2023-12-18 NOTE — Patient Instructions (Addendum)
 Medication Instructions:  Your physician recommends that you continue on your current medications as directed. Please refer to the Current Medication list given to you today.  *If you need a refill on your cardiac medications before your next appointment, please call your pharmacy*   Testing/Procedures: Dr. Wyline Mood has ordered a Lexiscan Myocardial Perfusion Imaging Study.  Please arrive 15 minutes prior to your appointment time for registration and insurance purposes.   The test will take approximately 3 to 4 hours to complete; you may bring reading material.  If someone comes with you to your appointment, they will need to remain in the main lobby due to limited space in the testing area. **If you are pregnant or breastfeeding, please notify the nuclear lab prior to your appointment**   How to prepare for your Myocardial Perfusion Test: Do not eat or drink 3 hours prior to your test, except you may have water. Do not consume products containing caffeine (regular or decaffeinated) 12 hours prior to your test. (ex: coffee, chocolate, sodas, tea). Do wear comfortable clothes (no dresses or overalls) and walking shoes, tennis shoes preferred (No heels or open toe shoes are allowed). Do NOT wear cologne, perfume, aftershave, or lotions (deodorant is allowed). If you use an inhaler, use it the AM of your test and bring it with you.  If you use a nebulizer, use it the AM of your test.  If these instructions are not followed, your test will have to be rescheduled.   Follow-Up: At [facility], you and your health needs are our priority.  As part of our continuing mission to provide you with exceptional heart care, we have created designated Provider Care Teams.  These Care Teams include your primary Cardiologist (physician) and Advanced Practice Providers (APPs -  Physician Assistants and Nurse Practitioners) who all work together to provide you with the care you need, when you need  it.  Your next appointment:   3 month(s)  Provider:   Any APP  Other Instructions

## 2023-12-18 NOTE — Progress Notes (Signed)
  Cardiology Office Note:  .   Date:  12/18/2023  ID:  Tracey Blackburn, DOB June 09, 1961, MRN [id number] PCP: Tracey Shackleton, NP-C  [facility] Providers Cardiologist:  None    History of Present Illness: Tracey Blackburn Kitchen   Tracey Blackburn is a 63 y.o. female with hx of asthma, was having SOB. BNP was normal. Xray showed some congestion. She had a cough a few weeks ago. She was wheezing.  She states she is short of breath, states she is SOB while sitting in clinic. She gets winded at work. She says she has to stop working at times due to symptoms. She notes some CP as well. She has never had a stress test. She uses pillows to sleep 2/2 SOB. No orthopnea. No LE edema. No smoking. Recent Bnp was negative Echo was normal 12/12/2022, RA pressure 3.  ROS:  per HPI otherwise negative   Studies Reviewed: Tracey Blackburn Kitchen        EKG Interpretation Date/Time:  Wednesday December 18 2023 14:16:08 EDT Ventricular Rate:  67 PR Interval:  176 QRS Duration:  104 QT Interval:  420 QTC Calculation: 443 R Axis:   -24  Text Interpretation: Normal sinus rhythm Incomplete right bundle Tracey Blackburn block When compared with ECG of 15-Feb-2023 09:36, PREVIOUS ECG IS PRESENT Confirmed by Tracey Blackburn ([id number]) on 12/18/2023 2:19:56 PM  Risk Assessment/Calculations:        Physical Exam:   VS:   Vitals:   12/18/23 1413  BP: 128/72  Pulse: 67  SpO2: 97%    Wt Readings from Last 3 Encounters:  12/03/23 209 lb (94.8 kg)  10/03/23 209 lb (94.8 kg)  09/26/23 205 lb (93 kg)    GEN: Well nourished, well developed in no acute distress NECK: No JVD;  CARDIAC: RRR, no murmurs, rubs, gallops RESPIRATORY:  Clear to auscultation without rales, wheezing or rhonchi  ABDOMEN: Soft, non-tender, non-distended EXTREMITIES:  No edema; No deformity   ASSESSMENT AND PLAN: .   SOB Cp She is euvolemic on exam. With very normal BNP, unlikely related to heart failure exacerbation. However she reports orthopnea. Further, she can also have elevated LVEDP  with exercise. She does not have cardiomegaly on her echo or CT.  She was prescribed. lasix 20 mg daily, can continue this.  Asthma is contributing as well as her weight/deconditioning. Can be post viral. In terms of her chest pain, She had a nongated CT in May of 2024, do not see significant CAC.  She also reported chest discomfort.  Did not see any significant coronary plaque on her CT however may have soft plaque can plan for stress test -Lexiscan stress   IRBBB This finding is benign.    Informed Consent   Shared Decision Making/Informed Consent The risks [chest pain, shortness of breath, cardiac arrhythmias, dizziness, blood pressure fluctuations, myocardial infarction, stroke/transient ischemic attack, nausea, vomiting, allergic reaction, radiation exposure, metallic taste sensation and life-threatening complications (estimated to be 1 in 10,000)], benefits (risk stratification, diagnosing coronary artery disease, treatment guidance) and alternatives of a nuclear stress test were discussed in detail with Tracey Blackburn and she agrees to proceed.     Dispo: Follow up in 3 months with an APP  Signed, Tracey Blackburn, Tracey Spittle, MD

## 2023-12-19 ENCOUNTER — Telehealth (HOSPITAL_COMMUNITY): Payer: Self-pay | Admitting: *Deleted

## 2023-12-19 ENCOUNTER — Ambulatory Visit: Payer: BC Managed Care – PPO | Admitting: Family Medicine

## 2023-12-19 NOTE — Telephone Encounter (Signed)
 Left message on voicemail per DPR in reference to upcoming appointment scheduled on 12/23/2023 at 10:30 with detailed instructions given per Myocardial Perfusion Study Information Sheet for the test. LM to arrive 15 minutes early, and that it is imperative to arrive on time for appointment to keep from having the test rescheduled. If you need to cancel or reschedule your appointment, please call the office within 24 hours of your appointment. Failure to do so may result in a cancellation of your appointment, and a $50 no show fee. Phone number given for call back for any questions.

## 2023-12-23 ENCOUNTER — Ambulatory Visit (HOSPITAL_COMMUNITY): Attending: Cardiovascular Disease

## 2023-12-23 DIAGNOSIS — R079 Chest pain, unspecified: Secondary | ICD-10-CM | POA: Diagnosis not present

## 2023-12-23 LAB — MYOCARDIAL PERFUSION IMAGING
LV dias vol: 84 mL (ref 46–106)
LV sys vol: 31 mL
Nuc Stress EF: 63 %
Peak HR: 105 {beats}/min
Rest HR: 78 {beats}/min
Rest Nuclear Isotope Dose: 9.7 mCi
SDS: 0
SRS: 0
SSS: 0
ST Depression (mm): 0 mm
Stress Nuclear Isotope Dose: 30.1 mCi
TID: 1.23

## 2023-12-23 MED ORDER — TECHNETIUM TC 99M TETROFOSMIN IV KIT: 30.1000 | PACK | Freq: Once | INTRAVENOUS | Status: AC | PRN

## 2023-12-23 MED ORDER — REGADENOSON 0.4 MG/5ML IV SOLN: 0.4000 mg | Freq: Once | INTRAVENOUS | Status: AC

## 2023-12-23 MED ORDER — TECHNETIUM TC 99M TETROFOSMIN IV KIT
9.7000 | PACK | Freq: Once | INTRAVENOUS | Status: AC | PRN
  Administered 2023-12-23: 9.7 via INTRAVENOUS

## 2023-12-24 ENCOUNTER — Encounter: Payer: Self-pay | Admitting: *Deleted

## 2024-01-31 ENCOUNTER — Encounter: Payer: Self-pay | Admitting: Family Medicine

## 2024-01-31 ENCOUNTER — Ambulatory Visit: Payer: BC Managed Care – PPO | Admitting: Family Medicine

## 2024-01-31 VITALS — BP 130/76 | HR 62 | Temp 98.4°F | Ht 62.0 in | Wt 212.0 lb

## 2024-01-31 DIAGNOSIS — K5900 Constipation, unspecified: Secondary | ICD-10-CM | POA: Insufficient documentation

## 2024-01-31 DIAGNOSIS — E669 Obesity, unspecified: Secondary | ICD-10-CM | POA: Insufficient documentation

## 2024-01-31 DIAGNOSIS — I10 Essential (primary) hypertension: Secondary | ICD-10-CM | POA: Diagnosis not present

## 2024-01-31 DIAGNOSIS — K51011 Ulcerative (chronic) pancolitis with rectal bleeding: Secondary | ICD-10-CM

## 2024-01-31 DIAGNOSIS — Z8719 Personal history of other diseases of the digestive system: Secondary | ICD-10-CM

## 2024-01-31 DIAGNOSIS — J4541 Moderate persistent asthma with (acute) exacerbation: Secondary | ICD-10-CM | POA: Diagnosis not present

## 2024-01-31 DIAGNOSIS — E559 Vitamin D deficiency, unspecified: Secondary | ICD-10-CM | POA: Diagnosis not present

## 2024-01-31 DIAGNOSIS — K625 Hemorrhage of anus and rectum: Secondary | ICD-10-CM | POA: Diagnosis not present

## 2024-01-31 DIAGNOSIS — K6289 Other specified diseases of anus and rectum: Secondary | ICD-10-CM

## 2024-01-31 LAB — LIPID PANEL
Cholesterol: 191 mg/dL (ref 0–200)
HDL: 57.7 mg/dL (ref >39.00)
LDL Cholesterol: 120 mg/dL — ABNORMAL HIGH (ref 0–99)
NonHDL: 132.83
Total CHOL/HDL Ratio: 3
Triglycerides: 62 mg/dL (ref 0.0–149.0)
VLDL: 12.4 mg/dL (ref 0.0–40.0)

## 2024-01-31 LAB — VITAMIN D 25 HYDROXY (VIT D DEFICIENCY, FRACTURES): VITD: 18.25 ng/mL — ABNORMAL LOW (ref 30.00–100.00)

## 2024-01-31 LAB — COMPREHENSIVE METABOLIC PANEL WITH GFR
ALT: 10 U/L (ref 0–35)
AST: 17 U/L (ref 0–37)
Albumin: 4.1 g/dL (ref 3.5–5.2)
Alkaline Phosphatase: 67 U/L (ref 39–117)
BUN: 8 mg/dL (ref 6–23)
CO2: 27 meq/L (ref 19–32)
Calcium: 9.1 mg/dL (ref 8.4–10.5)
Chloride: 103 meq/L (ref 96–112)
Creatinine, Ser: 0.72 mg/dL (ref 0.40–1.20)
GFR: 89.25 mL/min (ref >60.00)
Glucose, Bld: 93 mg/dL (ref 70–99)
Potassium: 3.9 meq/L (ref 3.5–5.1)
Sodium: 138 meq/L (ref 135–145)
Total Bilirubin: 0.5 mg/dL (ref 0.2–1.2)
Total Protein: 7.9 g/dL (ref 6.0–8.3)

## 2024-01-31 LAB — CBC WITH DIFFERENTIAL/PLATELET
Basophils Absolute: 0 10*3/uL (ref 0.0–0.1)
Basophils Relative: 0.4 % (ref 0.0–3.0)
Eosinophils Absolute: 0.4 10*3/uL (ref 0.0–0.7)
Eosinophils Relative: 7.6 % — ABNORMAL HIGH (ref 0.0–5.0)
HCT: 41.6 % (ref 36.0–46.0)
Hemoglobin: 13.4 g/dL (ref 12.0–15.0)
Lymphocytes Relative: 31.4 % (ref 12.0–46.0)
Lymphs Abs: 1.6 10*3/uL (ref 0.7–4.0)
MCHC: 32.3 g/dL (ref 30.0–36.0)
MCV: 82.7 fl (ref 78.0–100.0)
Monocytes Absolute: 0.7 10*3/uL (ref 0.1–1.0)
Monocytes Relative: 14.7 % — ABNORMAL HIGH (ref 3.0–12.0)
Neutro Abs: 2.3 10*3/uL (ref 1.4–7.7)
Neutrophils Relative %: 45.9 % (ref 43.0–77.0)
Platelets: 208 10*3/uL (ref 150.0–400.0)
RBC: 5.03 Mil/uL (ref 3.87–5.11)
RDW: 15.7 % — ABNORMAL HIGH (ref 11.5–15.5)
WBC: 5 10*3/uL (ref 4.0–10.5)

## 2024-01-31 LAB — HEMOGLOBIN A1C: Hgb A1c MFr Bld: 5.9 % (ref 4.6–6.5)

## 2024-01-31 LAB — TSH: TSH: 1.85 u[IU]/mL (ref 0.35–5.50)

## 2024-01-31 LAB — FERRITIN: Ferritin: 20.7 ng/mL (ref 10.0–291.0)

## 2024-01-31 LAB — T4, FREE: Free T4: 0.77 ng/dL (ref 0.60–1.60)

## 2024-01-31 MED ORDER — HYDROCORTISONE (PERIANAL) 2.5 % EX CREA: 1.0000 | TOPICAL_CREAM | Freq: Two times a day (BID) | CUTANEOUS | 0 refills | Status: AC

## 2024-01-31 MED ORDER — POLYETHYLENE GLYCOL 3350 17 GM/SCOOP PO POWD: 17.0000 g | Freq: Two times a day (BID) | ORAL | 1 refills | Status: AC | PRN

## 2024-01-31 NOTE — Assessment & Plan Note (Signed)
Counseling on diet and exercise

## 2024-01-31 NOTE — Assessment & Plan Note (Signed)
 Referral to [facility]

## 2024-01-31 NOTE — Assessment & Plan Note (Addendum)
 Blood with bowel movements.  Referral to [facility] gastroenterology.  Check labs including CBC, iron.  No acute distress.  She has a history of ulcerative colitis and hemorrhoids.  Start MiraLAX  to keep stools soft, increase water intake and use Anusol HC

## 2024-01-31 NOTE — Assessment & Plan Note (Signed)
 Followed by pulmonology. No asthma flare today. Recommend switching to Breztri  as recommended by pulmonologist. She will let her pulmonologist know about frequent albuterol  use.

## 2024-01-31 NOTE — Assessment & Plan Note (Signed)
 Referral to [facility] gastroenterology. Check labs including CBC, iron. No acute distress. She has a history of ulcerative colitis and hemorrhoids. Start MiraLAX  to keep stools soft, increase water intake and use Anusol HC

## 2024-01-31 NOTE — Assessment & Plan Note (Signed)
Check vitamin D level and follow up 

## 2024-01-31 NOTE — Patient Instructions (Signed)
 Use the hemorrhoid cream and do warm baths.  Keep your stool soft.  Try the MiraLAX  2 capfuls daily for 3 days and then back off to 1 capful daily.  I am referring you to [facility] gastroenterology.  Keep in touch with your pulmonologist regarding your breathing and albuterol  use I recommend that you go ahead and start the Breztri  as prescribed  I will be in touch with your results and with recommendations.

## 2024-01-31 NOTE — Assessment & Plan Note (Signed)
 Referral to [facility] since [facility] will not see her and [facility] refused referral. If she has any new or worsening symptoms, she will go to the ED.

## 2024-01-31 NOTE — Progress Notes (Signed)
 Subjective:     Patient ID: Tracey Blackburn, female    DOB: 1961/03/20, 64 y.o.   MRN: [id number]  Chief Complaint  Patient presents with   Medical Management of Chronic Issues    4 month F/U. Inhaler refills     HPI  History of Present Illness         Tracey Blackburn is here to follow-up on chronic health conditions.  Tracey Blackburn daughter is with Tracey Blackburn.  C/o seeing bright red with bowel movements.  States Tracey Blackburn has been having hard stools and feeling constipated.  In the past Tracey Blackburn has had issues with diarrhea.  History of ulcerative colitis. Tracey Blackburn is having rectal pain.  Possible hemorrhoids.   States Tracey Blackburn is a former patient in [facility] however, they will not see Tracey Blackburn due to having balance.  Tracey Blackburn has been referred to [facility] in the past and they refused to see Tracey Blackburn due to Tracey Blackburn being a patient at [facility].  They would like to establish with a new gastroenterologist.   Vitamin D  def-Tracey Blackburn is taking a multivitamin but no extra vitamin D .  Tracey Blackburn is under the care of cardiology and pulmonology and has seen them both since Tracey Blackburn last visit. States Tracey Blackburn uses the albuterol  inhaler 5 days per week.      Health Maintenance Due  Topic Date Due   Hepatitis C Screening  Never done   DTaP/Tdap/Td (1 - Tdap) Never done   Pneumococcal Vaccine 69-61 Years old (1 of 2 - PCV) Never done   MAMMOGRAM  Never done   Zoster Vaccines- Shingrix (1 of 2) Never done   COVID-19 Vaccine (1 - 2024-25 season) Never done    Past Medical History:  Diagnosis Date   Asthma    Diabetes mellitus without complication (HCC)    Hypertension    Ulcerative colitis [facility])     Past Surgical History:  Procedure Laterality Date   BIOPSY  12/14/2022   Procedure: BIOPSY;  Surgeon: Alvis Jourdain, MD;  Location: [facility];  Service: Gastroenterology;;   CESAREAN SECTION     COLONOSCOPY N/A 12/14/2022   Procedure: COLONOSCOPY;  Surgeon: Alvis Jourdain, MD;  Location: [facility];  Service: Gastroenterology;  Laterality: N/A;    ESOPHAGOGASTRODUODENOSCOPY N/A 12/14/2022   Procedure: ESOPHAGOGASTRODUODENOSCOPY (EGD);  Surgeon: Alvis Jourdain, MD;  Location: [facility];  Service: Gastroenterology;  Laterality: N/A;    Family History  Problem Relation Age of Onset   Hypertension Mother    Lung cancer Mother    Hypertension Father    Lung cancer Father    Diabetes Father    Epilepsy Sister    Cervical cancer Sister    Hypertension Brother    Diabetes Brother    Epilepsy Brother    Diabetes Brother    Asthma Grandchild    Diabetes Other    Hypertension Other     Social History   Socioeconomic History   Marital status: Divorced    Spouse name: Not on file   Number of children: Not on file   Years of education: Not on file   Highest education level: Not on file  Occupational History   Not on file  Tobacco Use   Smoking status: Never    Passive exposure: Past   Smokeless tobacco: Never  Vaping Use   Vaping status: Never Used  Substance and Sexual Activity   Alcohol use: No    Alcohol/week: 0.0 standard drinks of alcohol   Drug use: No   Sexual activity: Not Currently  Partners: Male    Birth control/protection: Post-menopausal    Comment: menarche 63yo, sexual debut 63yo  Other Topics Concern   Not on file  Social History Narrative   Not on file   Social Drivers of Health   Financial Resource Strain: Not on file  Food Insecurity: No Food Insecurity (02/15/2023)   Hunger Vital Sign    Worried About Running Out of Food in the Last Year: Never true    Ran Out of Food in the Last Year: Never true  Recent Concern: Food Insecurity - Food Insecurity Present (12/11/2022)   Hunger Vital Sign    Worried About Running Out of Food in the Last Year: Sometimes true    Ran Out of Food in the Last Year: Sometimes true  Transportation Needs: No Transportation Needs (02/15/2023)   PRAPARE - Administrator, Civil Service (Medical): No    Lack of Transportation (Non-Medical): No  Physical  Activity: Not on file  Stress: Not on file  Social Connections: Not on file  Intimate Partner Violence: Not At Risk (02/15/2023)   Humiliation, Afraid, Rape, and Kick questionnaire    Fear of Current or Ex-Partner: No    Emotionally Abused: No    Physically Abused: No    Sexually Abused: No    Outpatient Medications Prior to Visit  Medication Sig Dispense Refill   albuterol  (VENTOLIN  HFA) 108 (90 Base) MCG/ACT inhaler Inhale 2 puffs into the lungs every 6 (six) hours as needed for wheezing or shortness of breath. 8.5 g 5   atenolol  (TENORMIN ) 100 MG tablet Take 1 tablet by mouth once daily 90 tablet 0   benzonatate  (TESSALON ) 200 MG capsule Take 1 capsule (200 mg total) by mouth 2 (two) times daily as needed for cough. 20 capsule 0   budeson-glycopyrrolate-formoterol  (BREZTRI  AEROSPHERE) 160-9-4.8 MCG/ACT AERO Inhale 2 puffs into the lungs in the morning and at bedtime. 10.7 g 5   Cyanocobalamin  (B-12 PO) Take 1 tablet by mouth daily after breakfast.     ferrous sulfate  325 (65 FE) MG EC tablet Take 1 tablet (325 mg total) by mouth daily with breakfast. (Patient taking differently: Take 325 mg by mouth 2 (two) times daily with a meal.) 60 tablet 3   furosemide  (LASIX ) 20 MG tablet Take 1 tablet (20 mg total) by mouth daily. 90 tablet 3   guaiFENesin  (MUCINEX ) 600 MG 12 hr tablet Take 2 tablets (1,200 mg total) by mouth 2 (two) times daily. (Patient taking differently: Take 600 mg by mouth in the morning and at bedtime.) 30 tablet 0   omeprazole  (PRILOSEC) 40 MG capsule Take 1 capsule (40 mg total) by mouth daily. 30 capsule 5   predniSONE  (DELTASONE ) 20 MG tablet Take 2 tablets (40 mg total) by mouth daily with breakfast. 10 tablet 0   terbinafine  (LAMISIL  AT) 1 % cream Apply 1 Application topically 2 (two) times daily. 30 g 0   Vitamin D , Ergocalciferol , (DRISDOL ) 1.25 MG (50000 UNIT) CAPS capsule Take 1 capsule (50,000 Units total) by mouth every 7 (seven) days. 4 capsule 2    Facility-Administered Medications Prior to Visit  Medication Dose Route Frequency Provider Last Rate Last Admin   regadenoson  (LEXISCAN ) injection SOLN 0.4 mg  0.4 mg Intravenous Once O'Neal, Hamlet Thomas, MD       technetium tetrofosmin  (TC-MYOVIEW ) injection 30.1 millicurie  30.1 millicurie Intravenous Once PRN O'Neal, Cathay Clonts, MD        No Known Allergies  Review of Systems  Constitutional:  Negative for chills, fever, malaise/fatigue and weight loss.  Respiratory:  Positive for cough, shortness of breath and wheezing.        Chronic   Cardiovascular:  Negative for chest pain, palpitations and leg swelling.  Gastrointestinal:  Positive for blood in stool and constipation. Negative for abdominal pain, diarrhea, nausea and vomiting.  Genitourinary:  Negative for dysuria, frequency and urgency.  Musculoskeletal:  Negative for falls.  Neurological:  Negative for dizziness, tingling, focal weakness, loss of consciousness and headaches.  Psychiatric/Behavioral:  Negative for depression. The patient is not nervous/anxious.        Objective:     Physical Exam Constitutional:      General: Tracey Blackburn is not in acute distress.    Appearance: Tracey Blackburn is not ill-appearing.  HENT:     Mouth/Throat:     Mouth: Mucous membranes are moist.  Eyes:     Extraocular Movements: Extraocular movements intact.     Conjunctiva/sclera: Conjunctivae normal.  Cardiovascular:     Rate and Rhythm: Normal rate and regular rhythm.  Pulmonary:     Effort: Pulmonary effort is normal.     Breath sounds: Normal breath sounds.  Abdominal:     General: Bowel sounds are normal. There is no distension.     Palpations: Abdomen is soft.     Tenderness: There is no abdominal tenderness. There is no guarding or rebound.  Musculoskeletal:     Cervical back: Normal range of motion and neck supple.     Right lower leg: No edema.     Left lower leg: No edema.  Skin:    General: Skin is warm and dry.   Neurological:     General: No focal deficit present.     Mental Status: Tracey Blackburn is alert and oriented to person, place, and time.     Motor: No weakness.     Coordination: Coordination normal.     Gait: Gait normal.  Psychiatric:        Mood and Affect: Mood normal.        Behavior: Behavior normal.        Thought Content: Thought content normal.      BP 130/76 (BP Location: Left Arm, Patient Position: Sitting, Cuff Size: Normal)   Pulse 62   Temp 98.4 F (36.9 C) (Oral)   Ht 5\' 2"  (1.575 m)   Wt 212 lb (96.2 kg)   SpO2 98%   BMI 38.78 kg/m  Wt Readings from Last 3 Encounters:  01/31/24 212 lb (96.2 kg)  12/23/23 210 lb (95.3 kg)  12/18/23 210 lb (95.3 kg)       Assessment & Plan:   Problem List Items Addressed This Visit     BRBPR (bright red blood per rectum)   Blood with bowel movements.  Referral to [facility] gastroenterology.  Check labs including CBC, iron.  No acute distress.  Tracey Blackburn has a history of ulcerative colitis and hemorrhoids.  Start MiraLAX  to keep stools soft, increase water intake and use Anusol HC      Relevant Orders   Ferritin   CBC with Differential/Platelet   Comprehensive metabolic panel with GFR   Ambulatory referral to Gastroenterology   Constipation   Referral to [facility] gastroenterology. Check labs including CBC, iron. No acute distress. Tracey Blackburn has a history of ulcerative colitis and hemorrhoids. Start MiraLAX  to keep stools soft, increase water intake and use Anusol HC       Relevant Orders  TSH   T4, free   Ambulatory referral to Gastroenterology   Essential hypertension   Controlled. Continue medication. Monitor at home. Low sodium diet.       Relevant Orders   TSH   T4, free   CBC with Differential/Platelet   Comprehensive metabolic panel with GFR   History of ulcerative colitis   Referral to [facility]       Moderate persistent asthma with acute exacerbation   Followed by pulmonology. No asthma flare  today. Recommend switching to Breztri  as recommended by pulmonologist. Tracey Blackburn will let Tracey Blackburn pulmonologist know about frequent albuterol  use.       Obesity (BMI 30-39.9) - Primary   Counseling on diet and exercise.       Relevant Orders   Hemoglobin A1c   Lipid panel   TSH   T4, free   CBC with Differential/Platelet   Comprehensive metabolic panel with GFR   Ulcerative pancolitis with rectal bleeding [facility])   Referral to [facility] since [facility] will not see Tracey Blackburn and [facility] refused referral. If Tracey Blackburn has any new or worsening symptoms, Tracey Blackburn will go to the ED.       Relevant Orders   Ambulatory referral to Gastroenterology   Vitamin D  deficiency   Check vitamin D  level and follow up       Relevant Orders   VITAMIN D  25 Hydroxy (Vit-D Deficiency, Fractures)   Other Visit Diagnoses       Rectal pain       Relevant Orders   Ambulatory referral to Gastroenterology       I am having Tracey Blackburn start on polyethylene glycol powder and hydrocortisone. I am also having Tracey Blackburn maintain Tracey Blackburn guaiFENesin , ferrous sulfate , Cyanocobalamin  (B-12 PO), albuterol , omeprazole , terbinafine , Vitamin D  (Ergocalciferol ), predniSONE , benzonatate , Breztri  Aerosphere, atenolol , and furosemide .  Meds ordered this encounter  Medications   polyethylene glycol powder (GLYCOLAX /MIRALAX ) 17 GM/SCOOP powder    Sig: Take 17 g by mouth 2 (two) times daily as needed.    Dispense:  3350 g    Refill:  1    Supervising Provider:   Bambi Lever A [[id number]]   hydrocortisone (ANUSOL-HC) 2.5 % rectal cream    Sig: Place 1 Application rectally 2 (two) times daily.    Dispense:  30 g    Refill:  0    Supervising Provider:   Bambi Lever A [[id number]]

## 2024-01-31 NOTE — Assessment & Plan Note (Signed)
Controlled. Continue medication. Monitor at home. Low sodium diet.

## 2024-02-03 ENCOUNTER — Encounter: Payer: Self-pay | Admitting: Family Medicine

## 2024-02-03 ENCOUNTER — Other Ambulatory Visit: Payer: Self-pay | Admitting: Family Medicine

## 2024-02-03 DIAGNOSIS — E559 Vitamin D deficiency, unspecified: Secondary | ICD-10-CM

## 2024-02-03 MED ORDER — VITAMIN D (ERGOCALCIFEROL) 1.25 MG (50000 UNIT) PO CAPS: 50000.0000 [IU] | ORAL_CAPSULE | ORAL | 1 refills | Status: AC

## 2024-03-04 NOTE — Progress Notes (Signed)
 Cardiology Office Note:    Date:  03/05/2024  ID:  Tracey Blackburn, DOB May 08, 1961, MRN [id number] PCP: Abram Abraham, NP-C   HeartCare Providers Cardiologist:  None       Patient Profile:       Chief Complaint: 35-month follow-up for shortness of breath and chest pain History of Present Illness:  Tracey Blackburn is a 63 y.o. female with visit-pertinent history of moderate persistent asthma, hypertension, RBBB, GERD, vitamin D  deficiency, tachycardia, history of ulcerative colitis  Patient established with cardiology service on 12/18/2023 for shortness of breath and chest pain.  She saw her PCP on 09/2023 for shortness of breath and had a chest x-ray that showed cardiomegaly and some vascular congestion with a normal BNP and subsequently referred to cardiology.  Lexiscan  stress test was ordered and completed on 12/23/2023 showing a normal low risk study with no evidence of ischemia or infarction.  She does have history of echocardiogram back in 11/2022 with normal LVEF of 55 to 60%, no RWMA, grade 1 DD, RV function and size normal, no valvular abnormalities.   Discussed the use of AI scribe software for clinical note transcription with the patient, who gave verbal consent to proceed.  Today patient arrives to clinic with her daughter.  She notes she is doing well overall.  She denies any new or acute cardiovascular concerns or complaints.  She tells me that her chest pains have entirely resolved.  She is without any exertional chest pains.  She notes that she still does experience some shortness of breath.  Shortness of breath has been ongoing for greater than 1 year.  She denies any orthopnea or PND.  No leg swelling or sudden weight gain.  She works at [employer] on third shift and gets a lot of steps and denies any exertional symptoms.  Review of systems:  Please see the history of present illness. All other systems are reviewed and otherwise negative.      Studies Reviewed:         Echocardiogram 12/12/2022 1. Left ventricular ejection fraction, by estimation, is 55 to 60%. The  left ventricle has normal function. The left ventricle has no regional  wall motion abnormalities. Left ventricular diastolic parameters are  consistent with Grade I diastolic  dysfunction (impaired relaxation).   2. Right ventricular systolic function is normal. The right ventricular  size is normal.   3. The mitral valve is normal in structure. No evidence of mitral valve  regurgitation. No evidence of mitral stenosis.   4. The aortic valve is normal in structure. Aortic valve regurgitation is  not visualized. No aortic stenosis is present.   5. The inferior vena cava is normal in size with greater than 50%  respiratory variability, suggesting right atrial pressure of 3 mmHg.   6. Technically limited study due to poor sound wave transmission.   Lexiscan  Myoview  12/23/2023   The study is normal. The study is low risk.   LV perfusion is normal. There is no evidence of ischemia. There is no evidence of infarction.   Left ventricular function is normal. Nuclear stress EF: 63%. The left ventricular ejection fraction is normal (55-65%). End diastolic cavity size is normal.  Risk Assessment/Calculations:              Physical Exam:   VS:  BP 118/70 (BP Location: Right Arm, Patient Position: Sitting, Cuff Size: Large)   Pulse 71   Ht 5' 2 (1.575 m)   Wt 216 lb (98  kg)   BMI 39.51 kg/m    Wt Readings from Last 3 Encounters:  03/05/24 216 lb (98 kg)  01/31/24 212 lb (96.2 kg)  12/23/23 210 lb (95.3 kg)    GEN: Well nourished, well developed in no acute distress NECK: No JVD; No carotid bruits CARDIAC: RRR, no murmurs, rubs, gallops RESPIRATORY:  Clear to auscultation without rales, wheezing or rhonchi  ABDOMEN: Soft, non-tender, non-distended EXTREMITIES:  No edema; No acute deformity      Assessment and Plan:  Shortness of breath Chest pain Echocardiogram 11/2022 with LVEF  55-60%, no RWMA, no LVH, grade 1 DD Lexiscan  Myoview  12/23/2023 was low risk with no evidence of ischemia or infarction - Today she is euvolemic and well compensated on exam - Chest pain is resolved.  She denies any anginal symptoms, no indication for further ischemic evaluation at this time. - Chronic dyspnea is unchanged and likely related to a combination of moderate persistent asthma, deconditioning, and obesity.  She was without any orthopnea, PND, edema - Encouraged weight loss and heart healthy dieting - Will continue Lasix  20 mg daily  Hypertension Blood pressure today is 118/70 well-controlled - Continue current antihypertensive regimen  Moderate persistent asthma No acute asthma flare today - Managed by pulmonology  Right bundle branch block - She is euvolemic and well compensated, benign finding       Dispo:  Return in about 1 year (around 03/05/2025).  Signed, Ava Boatman, NP

## 2024-03-05 ENCOUNTER — Ambulatory Visit: Attending: Emergency Medicine | Admitting: Emergency Medicine

## 2024-03-05 ENCOUNTER — Encounter: Payer: Self-pay | Admitting: Emergency Medicine

## 2024-03-05 VITALS — BP 118/70 | HR 71 | Ht 62.0 in | Wt 216.0 lb

## 2024-03-05 DIAGNOSIS — I4510 Unspecified right bundle-branch block: Secondary | ICD-10-CM

## 2024-03-05 DIAGNOSIS — I10 Essential (primary) hypertension: Secondary | ICD-10-CM | POA: Diagnosis not present

## 2024-03-05 DIAGNOSIS — R079 Chest pain, unspecified: Secondary | ICD-10-CM

## 2024-03-05 DIAGNOSIS — J4541 Moderate persistent asthma with (acute) exacerbation: Secondary | ICD-10-CM | POA: Diagnosis not present

## 2024-03-05 DIAGNOSIS — R0609 Other forms of dyspnea: Secondary | ICD-10-CM | POA: Diagnosis not present

## 2024-03-05 NOTE — Patient Instructions (Signed)
 Medication Instructions:  NO CHANGES  Lab Work: NONE  Testing/Procedures: NONE  Follow-Up: At [facility], you and your health needs are our priority.  As part of our continuing mission to provide you with exceptional heart care, our providers are all part of one team.  This team includes your primary Cardiologist (physician) and Advanced Practice Providers or APPs (Physician Assistants and Nurse Practitioners) who all work together to provide you with the care you need, when you need it.  Your next appointment:   1 YEAR  Provider:   MADISON FOUNTAIN, DNP

## 2024-03-14 ENCOUNTER — Other Ambulatory Visit: Payer: Self-pay | Admitting: Family Medicine

## 2024-03-14 DIAGNOSIS — R000 Tachycardia, unspecified: Secondary | ICD-10-CM

## 2024-03-14 DIAGNOSIS — I10 Essential (primary) hypertension: Secondary | ICD-10-CM

## 2024-04-13 ENCOUNTER — Other Ambulatory Visit: Payer: Self-pay | Admitting: Internal Medicine

## 2024-04-21 ENCOUNTER — Telehealth: Payer: Self-pay

## 2024-04-21 NOTE — Telephone Encounter (Signed)
 Copied from CRM [id number]. Topic: Clinical - Prescription Issue >> Apr 17, 2024  3:07 PM Benton O wrote: Reason for CRM: patient daughter angelica is calling because [patient acid reflux medication she is on now she is having to pay for and patient daughter is requesting a alternate reflux medication that's covered under the insurance . Esomeprazole- the name of the alternate medication  [id number] [place] ([place]),  - [address] [place] ([place]) [place] [address] Phone: [phone] Fax: [phone] Hours: Not open 24 hours  ATC x1 LVM for patient to call our office back .

## 2024-05-12 ENCOUNTER — Other Ambulatory Visit: Payer: Self-pay | Admitting: Internal Medicine

## 2024-06-12 ENCOUNTER — Other Ambulatory Visit: Payer: Self-pay | Admitting: Family Medicine

## 2024-06-12 DIAGNOSIS — I10 Essential (primary) hypertension: Secondary | ICD-10-CM

## 2024-06-12 DIAGNOSIS — R000 Tachycardia, unspecified: Secondary | ICD-10-CM

## 2024-08-25 ENCOUNTER — Other Ambulatory Visit: Payer: Self-pay | Admitting: Internal Medicine

## 2024-08-27 NOTE — Telephone Encounter (Signed)
 Patient needs appointment for farther refills

## 2024-09-08 ENCOUNTER — Other Ambulatory Visit: Payer: Self-pay | Admitting: Family Medicine

## 2024-09-08 DIAGNOSIS — I10 Essential (primary) hypertension: Secondary | ICD-10-CM

## 2024-09-08 DIAGNOSIS — R000 Tachycardia, unspecified: Secondary | ICD-10-CM

## 2024-10-01 ENCOUNTER — Encounter: Admitting: Pulmonary Disease

## 2024-10-09 ENCOUNTER — Other Ambulatory Visit: Payer: Self-pay | Admitting: Family Medicine

## 2024-10-09 ENCOUNTER — Other Ambulatory Visit: Payer: Self-pay | Admitting: Emergency Medicine

## 2024-10-09 DIAGNOSIS — I10 Essential (primary) hypertension: Secondary | ICD-10-CM

## 2024-10-09 DIAGNOSIS — R000 Tachycardia, unspecified: Secondary | ICD-10-CM

## 2024-10-12 MED ORDER — FUROSEMIDE 20 MG PO TABS: 20.0000 mg | ORAL_TABLET | Freq: Every day | ORAL | 3 refills | Status: AC

## 2024-10-12 NOTE — Telephone Encounter (Signed)
 Labs not done in 6 months   In accordance with refill protocols, please review and address the following requirements before this medication refill can be authorized:  Labs

## 2024-10-13 ENCOUNTER — Other Ambulatory Visit: Payer: Self-pay | Admitting: Emergency Medicine
